# Patient Record
Sex: Female | Born: 1995 | Race: White | Hispanic: No | Marital: Married | Smoking: Former smoker
Health system: Southern US, Community
[De-identification: ages and names within clinical notes are randomized; demographics above are authoritative.]

## PROBLEM LIST (undated history)

## (undated) DIAGNOSIS — G43909 Migraine, unspecified, not intractable, without status migrainosus: Secondary | ICD-10-CM

## (undated) HISTORY — PX: WISDOM TOOTH EXTRACTION: SHX21

---

## 2018-08-20 ENCOUNTER — Encounter: Payer: Self-pay | Admitting: Obstetrics and Gynecology

## 2019-01-02 ENCOUNTER — Encounter: Payer: Self-pay | Admitting: Certified Nurse Midwife

## 2019-01-02 ENCOUNTER — Ambulatory Visit (INDEPENDENT_AMBULATORY_CARE_PROVIDER_SITE_OTHER): Payer: BLUE CROSS/BLUE SHIELD | Admitting: Certified Nurse Midwife

## 2019-01-02 ENCOUNTER — Other Ambulatory Visit (HOSPITAL_COMMUNITY)
Admission: RE | Admit: 2019-01-02 | Discharge: 2019-01-02 | Disposition: A | Discharge status: Home / Self Care | Payer: BLUE CROSS/BLUE SHIELD | Source: Ambulatory Visit | Attending: Certified Nurse Midwife | Admitting: Certified Nurse Midwife

## 2019-01-02 ENCOUNTER — Ambulatory Visit: Payer: Self-pay | Admitting: Adult Health

## 2019-01-02 VITALS — BP 112/81 | HR 85 | Ht 68.0 in | Wt 195.1 lb

## 2019-01-02 DIAGNOSIS — Z129 Encounter for screening for malignant neoplasm, site unspecified: Secondary | ICD-10-CM | POA: Insufficient documentation

## 2019-01-02 DIAGNOSIS — Z01419 Encounter for gynecological examination (general) (routine) without abnormal findings: Secondary | ICD-10-CM | POA: Diagnosis not present

## 2019-01-02 DIAGNOSIS — N946 Dysmenorrhea, unspecified: Secondary | ICD-10-CM | POA: Diagnosis not present

## 2019-01-02 DIAGNOSIS — N939 Abnormal uterine and vaginal bleeding, unspecified: Secondary | ICD-10-CM

## 2019-01-02 NOTE — Progress Notes (Signed)
GYNECOLOGY ANNUAL PREVENTATIVE CARE ENCOUNTER NOTE  Subjective:   Bonnie Ford is a 23 y.o. No obstetric history on file. female here for a routine annual gynecologic exam.  Current complaints: she complains of heavy painful periods.   Denies abnormal vaginal bleeding, discharge, pelvic pain, problems with intercourse or other gynecologic concerns. She is sexually active with female partner.    Gynecologic History Patient's last menstrual period was 12/12/2018 (exact date). Contraception: none Last Pap: n/a first one done today Last mammogram: n/a  Obstetric History OB History  Gravida Para Term Preterm AB Living  0 0 0 0 0 0  SAB TAB Ectopic Multiple Live Births  0 0 0 0 0    History reviewed. No pertinent past medical history.  History reviewed. No pertinent surgical history.  No current outpatient medications on file prior to visit.   No current facility-administered medications on file prior to visit.    Denies drugs, MJ, occasional alcohol ( 1 x month) exercises 3-4 time wk x 45 min.   Hx of migraines with aura, no surgical hx, no medical problems.   No Known Allergies  Social History:  reports that she has never smoked. She has never used smokeless tobacco. She reports previous alcohol use. She reports previous drug use.  Family History  Problem Relation Age of Onset  . Diabetes Mother   . Thyroid disease Mother   . Diabetes Sister     The following portions of the patient's history were reviewed and updated as appropriate: allergies, current medications, past family history, past medical history, past social history, past surgical history and problem list.  Review of Systems Pertinent items noted in HPI and remainder of comprehensive ROS otherwise negative.   Objective:  BP 112/81   Pulse 85   Ht 5\' 8"  (1.727 m)   Wt 195 lb 2 oz (88.5 kg)   LMP 12/12/2018 (Exact Date)   BMI 29.67 kg/m  CONSTITUTIONAL: Well-developed, well-nourished female in no acute  distress.  HENT:  Normocephalic, atraumatic, External right and left ear normal. Oropharynx is clear and moist EYES: Conjunctivae and EOM are normal. Pupils are equal, round, and reactive to light. No scleral icterus.  NECK: Normal range of motion, supple, no masses.  Normal thyroid.  SKIN: Skin is warm and dry. No rash noted. Not diaphoretic. No erythema. No pallor. MUSCULOSKELETAL: Normal range of motion. No tenderness.  No cyanosis, clubbing, or edema.  2+ distal pulses. NEUROLOGIC: Alert and oriented to person, place, and time. Normal reflexes, muscle tone coordination. No cranial nerve deficit noted. PSYCHIATRIC: Normal mood and affect. Normal behavior. Normal judgment and thought content. CARDIOVASCULAR: Normal heart rate noted, regular rhythm RESPIRATORY: Clear to auscultation bilaterally. Effort and breath sounds normal, no problems with respiration noted. BREASTS: Symmetric in size. No masses, skin changes, nipple drainage, or lymphadenopathy. ABDOMEN: Soft, normal bowel sounds, no distention noted.  No tenderness, rebound or guarding.  PELVIC: Normal appearing external genitalia; normal appearing vaginal mucosa and cervix.  No abnormal discharge noted.  Pap smear obtained.  Normal uterine size,right ovary feels enlarged, no uterine or adnexal tenderness.    Assessment and Plan:  Women's annual routine GYN exam Will follow up results of pap smear and manage accordingly. Mammogram Not indicated Labs. STD testing with pap smear. Declines blood work.  U/s ordered for AUB and enlarged ovary.  Pt encouraged to make another appointment to discuss birth control options for AUB.Discussed estrogen not option due to hx migraine with aura.   Routine preventative  health maintenance measures emphasized. Please refer to After Visit Summary for other counseling recommendations.   Doreene Burke, CNM

## 2019-01-02 NOTE — Patient Instructions (Signed)
Preventing Cervical Cancer  Cervical cancer is cancer that grows on the cervix. The cervix is at the bottom of the uterus. It connects the uterus to the vagina. The uterus is where a baby develops during pregnancy. Cancer occurs when cells become abnormal and start to grow out of control. Cervical cancer grows slowly and may not cause any symptoms at first. Over time, the cancer can grow deep into the cervix tissue and spread to other areas. If it is found early, cervical cancer can be treated effectively. You can also take steps to prevent this type of cancer. Most cases of cervical cancer are caused by an STI (sexually transmitted infection) called human papillomavirus (HPV). One way to reduce your risk of cervical cancer is to avoid infection with the HPV virus. You can do this by practicing safe sex and by getting the HPV vaccine. Getting regular Pap tests is also important because this can help identify changes in cells that could lead to cancer. Your chances of getting this disease can also be reduced by making certain lifestyle changes. How can I protect myself from cervical cancer? Preventing HPV infection  Ask your health care provider about getting the HPV vaccine. If you are 23 years old or younger, you may need to get this vaccine, which is given in three doses over 6 months. This vaccine protects against the types of HPV that could cause cancer.  Limit the number of people you have sex with. Also avoid having sex with people who have had many sex partners.  Use a latex condom during sex. Getting Pap tests  Get Pap tests regularly, starting at age 23. Talk with your health care provider about how often you need these tests. ? Most women who are 55?23 years of age should have a Pap test every 3 years. ? Most women who are 28?23 years of age should have a Pap test in combination with an HPV test every 5 years. ? Women with a higher risk of cervical cancer, such as those with a weakened  immune system or those who have been exposed to the drug diethylstilbestrol (DES), may need more frequent testing. Making other lifestyle changes  Do not use any products that contain nicotine or tobacco, such as cigarettes and e-cigarettes. If you need help quitting, ask your health care provider.  Eat at least 5 servings of fruits and vegetables every day.  Lose weight if you are overweight. Why are these changes important?  These changes and screening tests are designed to address the factors that are known to increase the risk of cervical cancer. Taking these steps is the best way to reduce your risk.  Having regular Pap tests will help identify changes in cells that could lead to cancer. Steps can then be taken to prevent cancer from developing.  These changes will also help find cervical cancer early. This type of cancer can be treated effectively if it is found early. It can be more dangerous and difficult to treat if cancer has grown deep into your cervix or has spread.  In addition to making you less likely to get cervical cancer, these changes will also provide other health benefits, such as the following: ? Practicing safe sex is important for preventing STIs and unplanned pregnancies. ? Avoiding tobacco can reduce your risk for other cancers and health issues. ? Eating a healthy diet and maintaining a healthy weight are good for your overall health. What can happen if changes are not made? In  the early stages, cervical cancer might not have any symptoms. It can take many years for the cancer to grow and get deep into the cervix tissue. This may be happening without you knowing about it. If you develop any symptoms, such as pelvic pain or unusual discharge or bleeding from your vagina, you should see your health care provider right away. If cervical cancer is not found early, you might need treatments such as radiation, chemotherapy, or surgery. In some cases, surgery may mean that  you will not be able to get pregnant or carry a pregnancy to term. Where to find support Talk with your health care provider, school nurse, or local health department for guidance about screening and vaccination. Some children and teens may be able to get the HPV vaccine free of charge through the U.S. government's Vaccines for Children [facility]) program. Other places that provide vaccinations include:  Public health clinics. Check with your local health department.  [place], where you would pay only what you can afford. To find one near you, check this website: http://lyons.com/  [place]. These are part of a program for Medicare and Medicaid patients who live in rural areas. The National Breast and Cervical Cancer Early Detection Program also provides breast and cervical cancer screenings and diagnostic services to low-income, uninsured, and underinsured women. Cervical cancer can be passed down through families. Talk with your health care provider or genetic counselor to learn more about genetic testing for cancer. Where to find more information Learn more about cervical cancer from:  [employer] of Gynecology: WirelessShades.ch  [place]: www.cancer.org/cancer/cervicalcancer/  U.S. Centers for Disease Control and Prevention: ParisianParasols.gl Summary  Talk with your health care provider about getting the HPV vaccine.  Be sure to get regular Pap tests as recommended by your health care provider.  See your health care provider right away if you have any pelvic pain or unusual discharge or bleeding from your vagina. This information is not intended to replace advice given to you by your health care provider. Make sure you discuss any questions you have with your health care provider. Document Released: 11/15/2015 Document Revised: 06/28/2016 Document Reviewed: 06/28/2016 Elsevier  Interactive Patient Education  2019 [place] 18-39 Years, Female Preventive care refers to lifestyle choices and visits with your health care provider that can promote health and wellness. What does preventive care include?   A yearly physical exam. This is also called an annual well check.  Dental exams once or twice a year.  Routine eye exams. Ask your health care provider how often you should have your eyes checked.  Personal lifestyle choices, including: ? Daily care of your teeth and gums. ? Regular physical activity. ? Eating a healthy diet. ? Avoiding tobacco and drug use. ? Limiting alcohol use. ? Practicing safe sex. ? Taking vitamin and mineral supplements as recommended by your health care provider. What happens during an annual well check? The services and screenings done by your health care provider during your annual well check will depend on your age, overall health, lifestyle risk factors, and family history of disease. Counseling Your health care provider may ask you questions about your:  Alcohol use.  Tobacco use.  Drug use.  Emotional well-being.  Home and relationship well-being.  Sexual activity.  Eating habits.  Work and work Statistician.  Method of birth control.  Menstrual cycle.  Pregnancy history. Screening You may have the following tests or measurements:  Height, weight,  and BMI.  Diabetes screening. This is done by checking your blood sugar (glucose) after you have not eaten for a while (fasting).  Blood pressure.  Lipid and cholesterol levels. These may be checked every 5 years starting at age 71.  Skin check.  Hepatitis C blood test.  Hepatitis B blood test.  Sexually transmitted disease (STD) testing.  BRCA-related cancer screening. This may be done if you have a family history of breast, ovarian, tubal, or peritoneal cancers.  Pelvic exam and Pap test. This may be done every 3 years starting at  age 66. Starting at age 67, this may be done every 5 years if you have a Pap test in combination with an HPV test. Discuss your test results, treatment options, and if necessary, the need for more tests with your health care provider. Vaccines Your health care provider may recommend certain vaccines, such as:  Influenza vaccine. This is recommended every year.  Tetanus, diphtheria, and acellular pertussis (Tdap, Td) vaccine. You may need a Td booster every 10 years.  Varicella vaccine. You may need this if you have not been vaccinated.  HPV vaccine. If you are 81 or younger, you may need three doses over 6 months.  Measles, mumps, and rubella (MMR) vaccine. You may need at least one dose of MMR. You may also need a second dose.  Pneumococcal 13-valent conjugate (PCV13) vaccine. You may need this if you have certain conditions and were not previously vaccinated.  Pneumococcal polysaccharide (PPSV23) vaccine. You may need one or two doses if you smoke cigarettes or if you have certain conditions.  Meningococcal vaccine. One dose is recommended if you are age 67-21 years and a first-year college student living in a residence hall, or if you have one of several medical conditions. You may also need additional booster doses.  Hepatitis A vaccine. You may need this if you have certain conditions or if you travel or work in places where you may be exposed to hepatitis A.  Hepatitis B vaccine. You may need this if you have certain conditions or if you travel or work in places where you may be exposed to hepatitis B.  Haemophilus influenzae type b (Hib) vaccine. You may need this if you have certain risk factors. Talk to your health care provider about which screenings and vaccines you need and how often you need them. This information is not intended to replace advice given to you by your health care provider. Make sure you discuss any questions you have with your health care provider. Document  Released: 12/27/2001 Document Revised: 06/13/2017 Document Reviewed: 09/01/2015 Elsevier Interactive Patient Education  2019 [employer].

## 2019-01-08 LAB — CYTOLOGY - PAP
Chlamydia: NEGATIVE
DIAGNOSIS: NEGATIVE
Neisseria Gonorrhea: NEGATIVE

## 2019-01-09 ENCOUNTER — Other Ambulatory Visit: Payer: BLUE CROSS/BLUE SHIELD

## 2019-01-29 ENCOUNTER — Other Ambulatory Visit: Payer: Self-pay

## 2019-01-29 ENCOUNTER — Ambulatory Visit
Admission: RE | Admit: 2019-01-29 | Discharge: 2019-01-29 | Disposition: A | Discharge status: Home / Self Care | Payer: BLUE CROSS/BLUE SHIELD | Source: Ambulatory Visit | Attending: Certified Nurse Midwife | Admitting: Certified Nurse Midwife

## 2019-01-29 ENCOUNTER — Other Ambulatory Visit: Payer: BLUE CROSS/BLUE SHIELD

## 2019-01-29 DIAGNOSIS — N939 Abnormal uterine and vaginal bleeding, unspecified: Secondary | ICD-10-CM | POA: Diagnosis not present

## 2019-09-29 ENCOUNTER — Emergency Department
Admission: EM | Admit: 2019-09-29 | Discharge: 2019-09-29 | Disposition: A | Discharge status: Home / Self Care | Payer: BLUE CROSS/BLUE SHIELD | Attending: Emergency Medicine | Admitting: Emergency Medicine

## 2019-09-29 ENCOUNTER — Other Ambulatory Visit: Payer: Self-pay

## 2019-09-29 DIAGNOSIS — F17210 Nicotine dependence, cigarettes, uncomplicated: Secondary | ICD-10-CM | POA: Insufficient documentation

## 2019-09-29 DIAGNOSIS — K0889 Other specified disorders of teeth and supporting structures: Secondary | ICD-10-CM | POA: Insufficient documentation

## 2019-09-29 MED ORDER — IBUPROFEN 800 MG PO TABS: 800.0000 mg | ORAL_TABLET | Freq: Three times a day (TID) | ORAL | 0 refills | Status: AC | PRN

## 2019-09-29 MED ORDER — AMOXICILLIN 875 MG PO TABS: 875.0000 mg | ORAL_TABLET | Freq: Two times a day (BID) | ORAL | 0 refills | Status: AC

## 2019-09-29 NOTE — Discharge Instructions (Signed)
OPTIONS FOR DENTAL FOLLOW UP CARE ° °[place] Department of Health and Human Services - Local Safety Net Dental Clinics °http://www.ncdhhs.gov/dph/oralhealth/services/safetynetclinics.htm °  °[facility] (336-562-3123) ° °[facility] [place] (919-933-9087) ° °[facility] (919-663-1744 ext [phone]) ° °[facility] ([phone]) ° °[facility] ([phone]) °This clinic caters to the indigent population and is on a lottery system. °Location: °[facility], [address], [place] °Clinic Hours: °Wednesdays from 6pm - 9pm, patients seen by a lottery system. °For dates, call or go to www.med.[facility].edu/[facility]/patients/Dental-[facility] °Services: °Cleanings, fillings and simple extractions. °Payment Options: °DENTAL WORK IS FREE OF CHARGE. Bring proof of income or support. °Best way to get seen: °Arrive at 5:15 pm - this is a lottery, NOT first come/first serve, so arriving earlier will not increase your chances of being seen. °  °  °[facility] °[phone] °Select option 1 for emergencies °  °Location: °[facility], [address], [place] °Clinic Hours: °No walk-ins accepted - call the day before to schedule an appointment. °Check in times are 9:30 am and 1:30 pm. °Services: °Simple extractions, temporary fillings, pulpectomy/pulp debridement, uncomplicated abscess drainage. °Payment Options: °PAYMENT IS DUE AT THE TIME OF SERVICE.  Fee is usually $100-200, additional surgical procedures (e.g. abscess drainage) may be extra. °Cash, checks, Visa/MasterCard accepted.  Can file Medicaid if patient is covered for dental - patient should call case worker to check. °No discount for [facility] patients. °Best way to get seen: °MUST call the day before and get onto the schedule. Can usually be seen the next 1-2 days. No walk-ins accepted. °  °  °[facility] °[phone] °   °Location: °[facility], [address], [place] °Clinic Hours: °M, W, Th, F 8am or 1:30pm, Tues 9a or 1:30 - first come/first served. °Services: °Simple extractions, temporary fillings, uncomplicated abscess drainage.  You do not need to be an [place] resident. °Payment Options: °PAYMENT IS DUE AT THE TIME OF SERVICE. °Dental insurance, otherwise sliding scale - bring proof of income or support. °Depending on income and treatment needed, cost is usually $50-200. °Best way to get seen: °Arrive early as it is first come/first served. °  °  °[facility] °[phone] °  °Location: °[address] °Clinic Hours: °Mon-Thu 8a-5p °Services: °Most basic dental services including extractions and fillings. °Payment Options: °PAYMENT IS DUE AT THE TIME OF SERVICE. °Sliding scale, up to 50% off - bring proof if income or support. °Medicaid with dental option accepted. °Best way to get seen: °Call to schedule an appointment, can usually be seen within 2 weeks OR they will try to see walk-ins - show up at 8a or 2p (you may have to wait). °  °  °[facility] °[phone] °[place] RESIDENTS ONLY °  °Location: °[facility], [address], [place], [place] [address] °Clinic Hours: By appointment only. °Monday - Thursday 8am-5pm, Friday 8am-12pm °Services: Cleanings, fillings, extractions. °Payment Options: °PAYMENT IS DUE AT THE TIME OF SERVICE. °Cash, Visa or MasterCard. Sliding scale - $30 minimum per service. °Best way to get seen: °Come in to office, complete packet and make an appointment - need proof of income °or support monies for each household member and proof of [place] residence. °Usually takes about a month to get in. °  °  °[facility] °[phone] °  °Location: °[address]   [place] °Clinic Hours: Walk-in Urgent Care Dental Services are offered Monday-Friday  mornings only. °The numbers of emergencies accepted daily is limited to the number of °providers available. °Maximum 15 - Mondays, Wednesdays & Thursdays °Maximum 10 - Tuesdays & Fridays °Services: °You do not need to be a [place] resident to be seen for a dental emergency. °Emergencies are defined as pain, swelling, abnormal bleeding, or dental trauma. Walkins will receive x-rays if needed. °NOTE: Dental cleaning is not an emergency. °Payment Options: °PAYMENT IS DUE AT THE TIME OF SERVICE. °Minimum co-pay is $40.00 for uninsured patients. °Minimum co-pay is $3.00 for Medicaid with dental coverage. °Dental Insurance is accepted and must be presented at time of visit. °Medicare does not cover dental. °Forms of payment: Cash, credit card, checks. °Best way to get seen: °If not previously registered with the clinic, walk-in dental registration begins at 7:15 am and is on a first come/first serve basis. °If previously registered with the clinic, call to make an appointment. °  °  °[facility] °[phone] °[place] RESIDENTS ONLY °  °Location: °[address], [place], [place] °Clinic Hours: °Mon-Thu 10a-2p °Services: Extractions only! °Payment Options: °FREE (donations accepted) - bring proof of income or support °Best way to get seen: °Call and schedule an appointment OR come at 8am on the 1st Monday of every month (except for holidays) when it is first come/first served. °  °  °[facility] °[phone] °  °Location: °[address], [place] °Clinic Hours: °Friday mornings °Services, Payment Options, Best way to get seen: °Call for info °

## 2019-09-29 NOTE — ED Triage Notes (Signed)
Pt arrives to ED c/o R lower wisdom tooth pain. Thinks poss abscess. States has a plan to get wisdom teeth removed. C/o R jaw pain. A&O, ambulatory. Speaking in complete sentences.

## 2019-09-29 NOTE — ED Provider Notes (Signed)
[facility] Emergency Department Provider Note  ____________________________________________  Time seen: Approximately 4:14 PM  I have reviewed the triage vital signs and the nursing notes.   HISTORY  Chief Complaint Dental Pain    HPI Bonnie Ford is a 23 y.o. female presents to the emergency department with right lower jaw pain and swelling.  Patient states that she feels like her symptoms are coming from her wisdom teeth.  She denies fever or chills at home.  She states that intensity of pain made her leave work today and now she needs a work note.  She has not attempted any alleviating measures at home and does not have an appointment with a local dentist.        History reviewed. No pertinent past medical history.  Patient Active Problem List   Diagnosis Date Noted  . Painful menstrual periods 01/02/2019    History reviewed. No pertinent surgical history.  Prior to Admission medications   Medication Sig Start Date End Date Taking? Authorizing Provider  amoxicillin (AMOXIL) 875 MG tablet Take 1 tablet (875 mg total) by mouth 2 (two) times daily for 10 days. 09/29/19 10/09/19  Lannie Fields, PA-C  ibuprofen (ADVIL) 800 MG tablet Take 1 tablet (800 mg total) by mouth every 8 (eight) hours as needed for up to 5 days. 09/29/19 10/04/19  Lannie Fields, PA-C    Allergies Patient has no known allergies.  Family History  Problem Relation Age of Onset  . Diabetes Mother   . Thyroid disease Mother   . Diabetes Sister     Social History Social History   Tobacco Use  . Smoking status: Current Every Day Smoker  . Smokeless tobacco: Never Used  Substance Use Topics  . Alcohol use: Not Currently  . Drug use: Not Currently     Review of Systems  Constitutional: No fever/chills Eyes: No visual changes. No discharge ENT: Patient has dental pain.  Cardiovascular: no chest pain. Respiratory: no cough. No SOB. Gastrointestinal: No abdominal  pain.  No nausea, no vomiting.  No diarrhea.  No constipation. Genitourinary: Negative for dysuria. No hematuria Musculoskeletal: Negative for musculoskeletal pain. Skin: Negative for rash, abrasions, lacerations, ecchymosis. Neurological: Negative for headaches, focal weakness or numbness.   ____________________________________________   PHYSICAL EXAM:  VITAL SIGNS: ED Triage Vitals  Enc Vitals Group     BP 09/29/19 1348 (!) 140/96     Pulse Rate 09/29/19 1348 72     Resp 09/29/19 1348 16     Temp 09/29/19 1348 98.6 F (37 C)     Temp Source 09/29/19 1348 Oral     SpO2 09/29/19 1348 98 %     Weight 09/29/19 1349 190 lb (86.2 kg)     Height 09/29/19 1349 5\' 8"  (1.727 m)     Head Circumference --      Peak Flow --      Pain Score 09/29/19 1349 8     Pain Loc --      Pain Edu? --      Excl. in [place]? --      Constitutional: Alert and oriented. Well appearing and in no acute distress. Eyes: Conjunctivae are normal. PERRL. EOMI. Head: Atraumatic. ENT:      Nose: No congestion/rhinnorhea.      Mouth/Throat: Mucous membranes are moist.  Patient has mild right lower jaw swelling.  No tenderness to palpation underneath the tongue.  Patient has exceptionally healthy dentition overall with no dental caries visualized. Neck:  No stridor.  No cervical spine tenderness to palpation. Cardiovascular: Normal rate, regular rhythm. Normal S1 and S2.  Good peripheral circulation. Respiratory: Normal respiratory effort without tachypnea or retractions. Lungs CTAB. Good air entry to the bases with no decreased or absent breath sounds. Skin:  Skin is warm, dry and intact. No rash noted. Psychiatric: Mood and affect are normal. Speech and behavior are normal. Patient exhibits appropriate insight and judgement.   ____________________________________________   LABS (all labs ordered are listed, but only abnormal results are displayed)  Labs Reviewed - No data to  display ____________________________________________  EKG   ____________________________________________  RADIOLOGY   No results found.  ____________________________________________    PROCEDURES  Procedure(s) performed:    Procedures    Medications - No data to display   ____________________________________________   INITIAL IMPRESSION / ASSESSMENT AND PLAN / ED COURSE  Pertinent labs & imaging results that were available during my care of the patient were reviewed by me and considered in my medical decision making (see chart for details).  Review of the [place] CSRS was performed in accordance of the [facility] prior to dispensing any controlled drugs.         Assessment and plan Dental pain 23 year old female presents to the emergency department with mild right lower jaw swelling and concern for wisdom tooth type pain.  Patient was discharged with amoxicillin and ibuprofen 800s.  She was advised to make an appointment with a local dentist as soon as possible.  Dental resources were provided in patient's discharge paperwork.  All patient questions were answered.     ____________________________________________  FINAL CLINICAL IMPRESSION(S) / ED DIAGNOSES  Final diagnoses:  Pain, dental      NEW MEDICATIONS STARTED DURING THIS VISIT:  ED Discharge Orders         Ordered    amoxicillin (AMOXIL) 875 MG tablet  2 times daily     09/29/19 1556    ibuprofen (ADVIL) 800 MG tablet  Every 8 hours PRN     09/29/19 1556              This chart was dictated using voice recognition software/Dragon. Despite best efforts to proofread, errors can occur which can change the meaning. Any change was purely unintentional.    Orvil Feil, PA-C 09/29/19 1617    Dionne Bucy, MD 09/29/19 [phone]

## 2019-11-19 ENCOUNTER — Ambulatory Visit: Payer: 59 | Attending: Internal Medicine

## 2019-11-19 DIAGNOSIS — Z20822 Contact with and (suspected) exposure to covid-19: Secondary | ICD-10-CM

## 2019-11-21 LAB — NOVEL CORONAVIRUS, NAA: SARS-CoV-2, NAA: DETECTED — AB

## 2019-11-22 ENCOUNTER — Ambulatory Visit: Payer: 59 | Attending: Internal Medicine

## 2019-11-22 DIAGNOSIS — Z20822 Contact with and (suspected) exposure to covid-19: Secondary | ICD-10-CM

## 2019-11-24 LAB — NOVEL CORONAVIRUS, NAA: SARS-CoV-2, NAA: NOT DETECTED

## 2020-12-16 DIAGNOSIS — Z20822 Contact with and (suspected) exposure to covid-19: Secondary | ICD-10-CM | POA: Diagnosis not present

## 2020-12-16 DIAGNOSIS — Z03818 Encounter for observation for suspected exposure to other biological agents ruled out: Secondary | ICD-10-CM | POA: Diagnosis not present

## 2021-02-02 ENCOUNTER — Emergency Department: Payer: Self-pay

## 2021-02-02 ENCOUNTER — Other Ambulatory Visit: Payer: Self-pay

## 2021-02-02 ENCOUNTER — Emergency Department
Admission: EM | Admit: 2021-02-02 | Discharge: 2021-02-02 | Disposition: A | Discharge status: Home / Self Care | Payer: Self-pay | Attending: Emergency Medicine | Admitting: Emergency Medicine

## 2021-02-02 DIAGNOSIS — X501XXA Overexertion from prolonged static or awkward postures, initial encounter: Secondary | ICD-10-CM | POA: Insufficient documentation

## 2021-02-02 DIAGNOSIS — Y9301 Activity, walking, marching and hiking: Secondary | ICD-10-CM | POA: Insufficient documentation

## 2021-02-02 DIAGNOSIS — S93402A Sprain of unspecified ligament of left ankle, initial encounter: Secondary | ICD-10-CM | POA: Insufficient documentation

## 2021-02-02 MED ORDER — ACETAMINOPHEN 325 MG PO TABS
650.0000 mg | ORAL_TABLET | Freq: Once | ORAL | Status: AC
  Administered 2021-02-02: 650 mg via ORAL
  Filled 2021-02-02: qty 2

## 2021-02-02 MED ORDER — MELOXICAM 15 MG PO TABS: 15.0000 mg | ORAL_TABLET | Freq: Every day | ORAL | 0 refills | Status: AC

## 2021-02-02 MED ORDER — MELOXICAM 7.5 MG PO TABS
15.0000 mg | ORAL_TABLET | Freq: Once | ORAL | Status: AC
  Administered 2021-02-02: 15 mg via ORAL
  Filled 2021-02-02: qty 2

## 2021-02-02 NOTE — ED Triage Notes (Signed)
Pt in with co left ankle pain, was walking and twisted it. STates heard a pop, no obvious deformity at this time.

## 2021-02-02 NOTE — Discharge Instructions (Addendum)
Use antiinflammatory as prescribed, once daily.  I may also use Tylenol, up to 1000 mg 4 times daily.  Please follow-up with orthopedics if symptoms do not improve in 1 to 2 weeks.

## 2021-02-03 NOTE — ED Provider Notes (Signed)
[facility] Emergency Department Provider Note  ____________________________________________   Event Date/Time   First MD Initiated Contact with Patient 02/02/21 2124     (approximate)  I have reviewed the triage vital signs and the nursing notes.   HISTORY  Chief Complaint Ankle Injury  HPI Bonnie Ford is a 25 y.o. female who reports to the emergency department for evaluation of left ankle pain.  Patient states that she was stepping down a step, and her foot inverted.  She states that she heard a pop and had immediate pain to the lateral aspect of her ankle.  She has been able to weight-bear on it since that time, but with increased pain.  She denies any prior injuries.  No alleviating measures have been attempted prior to arrival.         No past medical history on file.  Patient Active Problem List   Diagnosis Date Noted  . Painful menstrual periods 01/02/2019    No past surgical history on file.  Prior to Admission medications   Medication Sig Start Date End Date Taking? Authorizing Provider  meloxicam (MOBIC) 15 MG tablet Take 1 tablet (15 mg total) by mouth daily for 15 days. 02/02/21 02/17/21 Yes Lucy Chris, PA    Allergies Patient has no known allergies.  Family History  Problem Relation Age of Onset  . Diabetes Mother   . Thyroid disease Mother   . Diabetes Sister     Social History Social History   Tobacco Use  . Smoking status: Current Every Day Smoker  . Smokeless tobacco: Never Used  Substance Use Topics  . Alcohol use: Not Currently  . Drug use: Not Currently    Review of Systems Constitutional: No fever/chills Eyes: No visual changes. ENT: No sore throat. Cardiovascular: Denies chest pain. Respiratory: Denies shortness of breath. Gastrointestinal: No abdominal pain.  No nausea, no vomiting.  No diarrhea.  No constipation. Genitourinary: Negative for dysuria. Musculoskeletal: + Left ankle pain, negative  for back pain. Skin: Negative for rash. Neurological: Negative for headaches, focal weakness or numbness.   ____________________________________________   PHYSICAL EXAM:  VITAL SIGNS: ED Triage Vitals  Enc Vitals Group     BP 02/02/21 2135 118/72     Pulse Rate 02/02/21 2135 89     Resp 02/02/21 2135 17     Temp 02/02/21 2135 99.2 F (37.3 C)     Temp Source 02/02/21 2135 Oral     SpO2 02/02/21 2135 98 %     Weight 02/02/21 2126 215 lb (97.5 kg)     Height 02/02/21 2126 5\' 8"  (1.727 m)     Head Circumference --      Peak Flow --      Pain Score 02/02/21 2126 6     Pain Loc --      Pain Edu? --      Excl. in [place]? --    Constitutional: Alert and oriented. Well appearing and in no acute distress. Eyes: Conjunctivae are normal. PERRL. EOMI. Head: Atraumatic. Neck: No stridor.   Cardiovascular: Normal rate, regular rhythm. Grossly normal heart sounds.  Good peripheral circulation. Respiratory: Normal respiratory effort.  No retractions. Lungs CTAB. Musculoskeletal: There is tenderness to palpation to the lateral aspect of the ankle, most prominent just anterior to the lateral malleolus.  There is no ecchymosis, but there is a mild amount of soft tissue swelling.  Patient is able to actively dorsiflex and plantarflex ankle without difficulty.  Dorsal pedal  pulse 2+. Neurologic:  Normal speech and language. No gross focal neurologic deficits are appreciated.  Gait not assessed secondary to left ankle pain Skin:  Skin is warm, dry and intact. No rash noted. Psychiatric: Mood and affect are normal. Speech and behavior are normal.   ____________________________________________  RADIOLOGY I, Lucy Chris, personally viewed and evaluated these images (plain radiographs) as part of my medical decision making, as well as reviewing the written report by the radiologist.  ED provider interpretation: X-rays of the left ankle do not reveal any acute fracture  Official radiology  report(s): DG Ankle Complete Left  Result Date: 02/02/2021 CLINICAL DATA:  Left ankle pain, twisting injury EXAM: LEFT ANKLE COMPLETE - 3+ VIEW COMPARISON:  None. FINDINGS: Lateral soft tissue swelling. No fracture, subluxation or dislocation. Joint spaces maintained. 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 IMPRESSION: No fracture. Electronically Signed   By: Charlett Nose M.D.   On: 02/02/2021 21:50    ____________________________________________   INITIAL IMPRESSION / ASSESSMENT AND PLAN / ED COURSE  As part of my medical decision making, I reviewed the following data within the electronic MEDICAL RECORD NUMBER Nursing notes reviewed and incorporated, Radiograph reviewed and Notes from prior ED visits        Patient is a 25 year old female who reports to the emergency department for evaluation of left ankle pain.  See HPI for further details.  In triage, patient normal vital signs.  On physical exam there is soft tissue swelling and tenderness noted to the lateral aspect of the left ankle.  She has the ability to actively dorsiflex and plantarflex the ankle with minimal difficulty.  She is neurovascularly intact.  X-rays were obtained and are negative for acute fracture.  Will place the patient in a lace up ASO ankle brace and treat the patient with anti-inflammatories and recommend Tylenol as well.  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  Patient was instructed to follow-up with orthopedics if not improved in 1 to 2 weeks.  Patient is amenable this plan and questions were answered.  She is stable this time for outpatient follow-up.      ____________________________________________   FINAL CLINICAL IMPRESSION(S) / ED DIAGNOSES  Final diagnoses:  Sprain of left ankle, unspecified ligament, initial encounter     ED Discharge Orders         Ordered    meloxicam  (MOBIC) 15 MG tablet  Daily        02/02/21 2203          *Please note:  Bonnie Ford was evaluated in Emergency Department on 02/03/2021 for the symptoms described in the history of present illness. She was evaluated in the context of the global COVID-19 pandemic, which necessitated consideration that the patient might be at risk for infection with the SARS-CoV-2 virus that causes COVID-19. Institutional protocols and algorithms that pertain to the evaluation of patients at risk for COVID-19 are in a state of rapid change based on information released by regulatory bodies including the CDC and federal and state organizations. These policies and algorithms were followed during the patient's care in the ED.  Some ED evaluations and interventions may be delayed as a result of limited staffing during and the pandemic.*   Note:  This document was prepared using Dragon voice recognition software and may include unintentional dictation errors.   Lucy Chris,  PA 02/03/21 2114    Phineas Semen, MD 02/03/21 2153

## 2021-02-04 IMAGING — US US PELVIS COMPLETE WITH TRANSVAGINAL
1 series · 13 of 25 positions shown · non-contrast
Comparison: None

CLINICAL DATA: Painful heavy menses for years

EXAM:
TRANSABDOMINAL AND TRANSVAGINAL ULTRASOUND OF PELVIS
TECHNIQUE: Both transabdominal and transvaginal ultrasound examinations of the
pelvis were performed. Transabdominal technique was performed for
global imaging of the pelvis including uterus, ovaries, adnexal
regions, and pelvic cul-de-sac. It was necessary to proceed with
endovaginal exam following the transabdominal exam to visualize the
endometrium and ovaries.

[Series 1: us pelvis complete with transvaginal · 0.20mm/px · 13 of 131 slices shown]
[im 1/131]
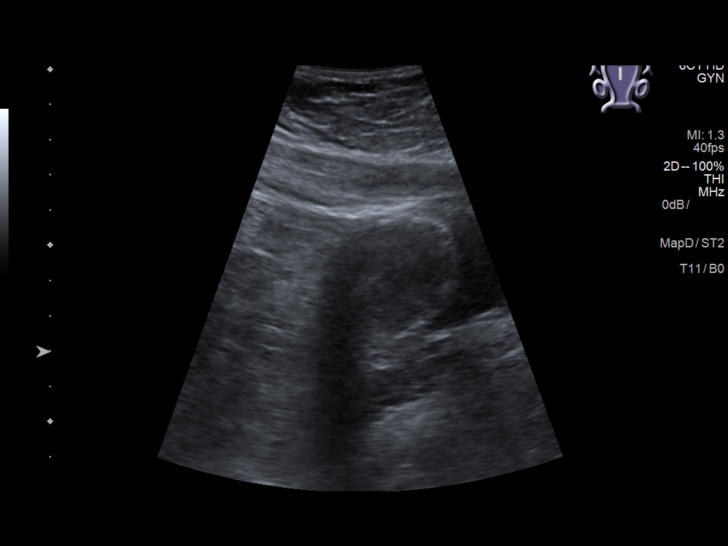
[im 11/131]
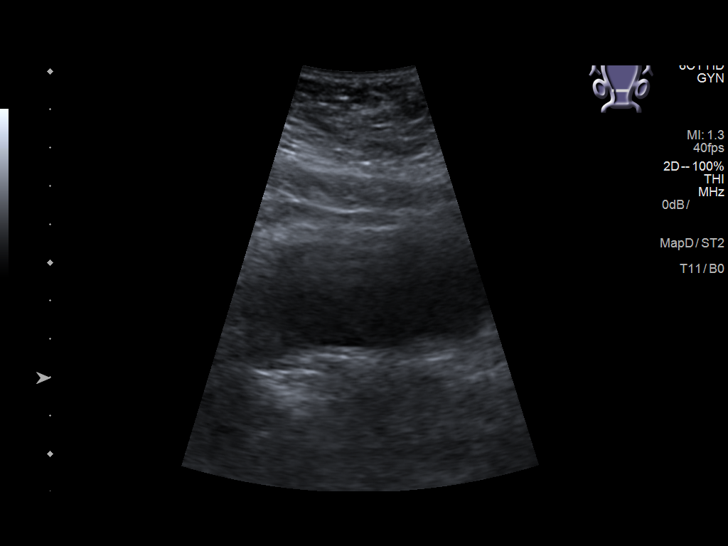
[im 22/131]
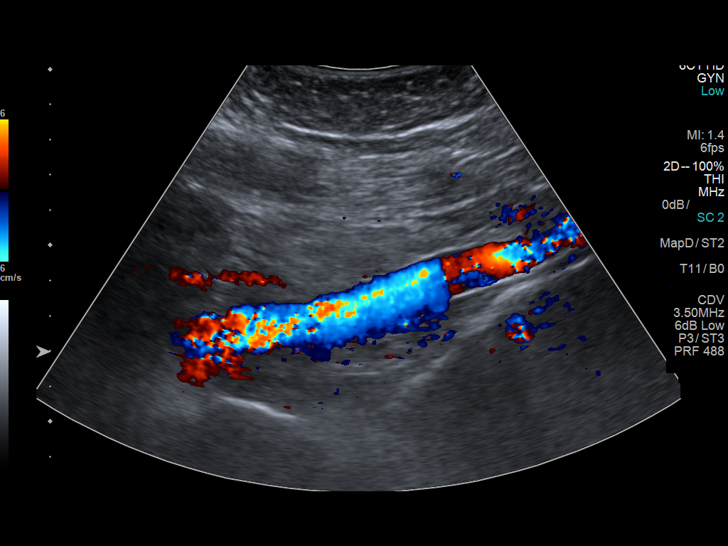
[im 33/131]
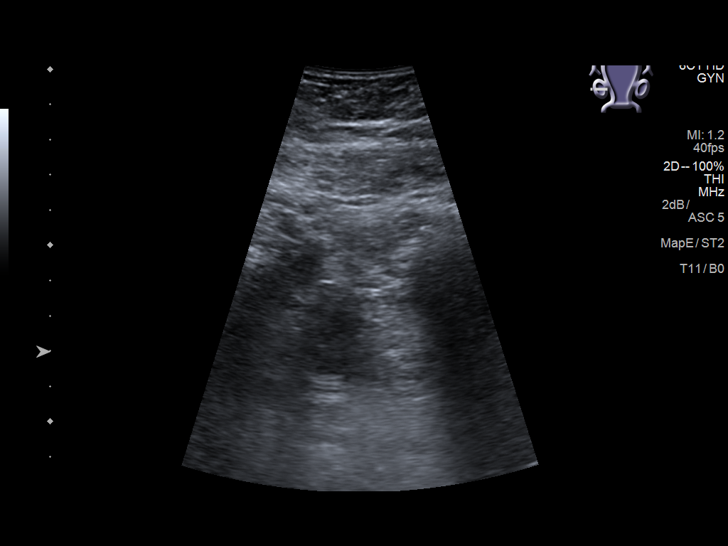
[im 44/131]
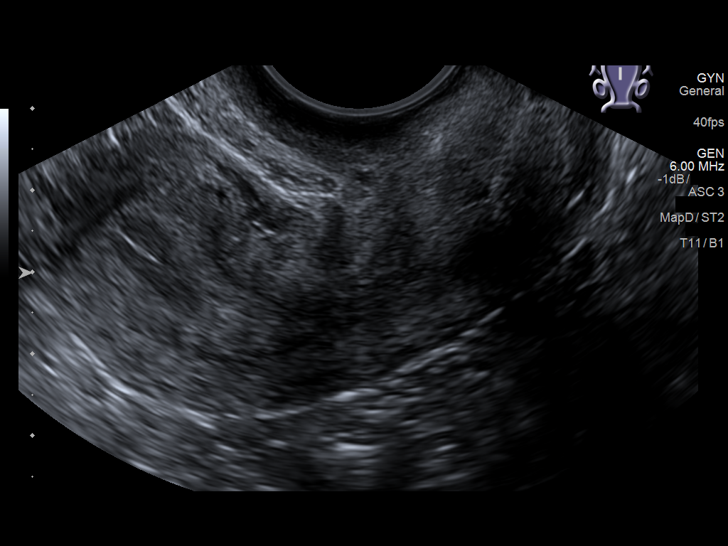
[im 55/131]
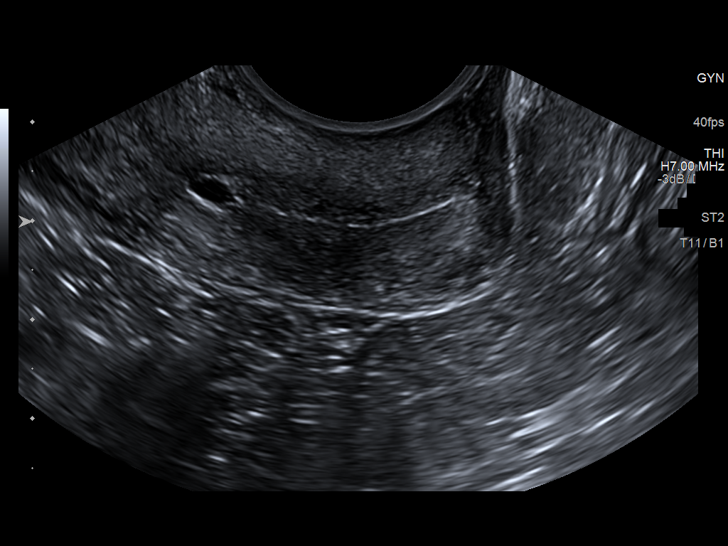
[im 66/131]
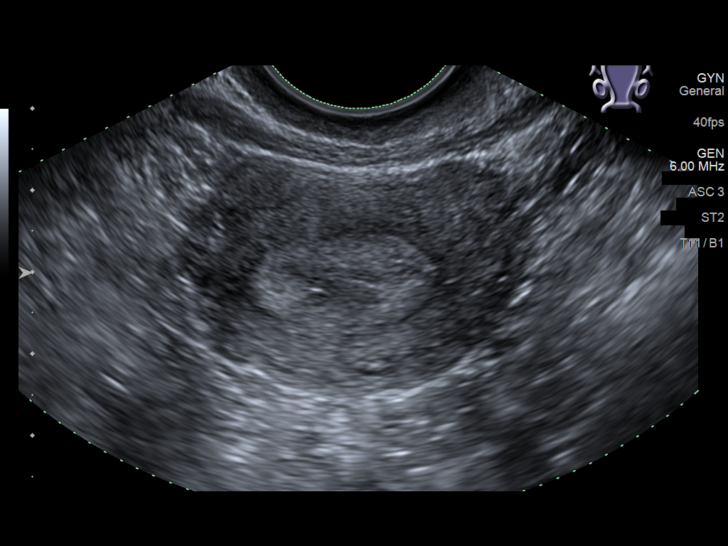
[im 76/131]
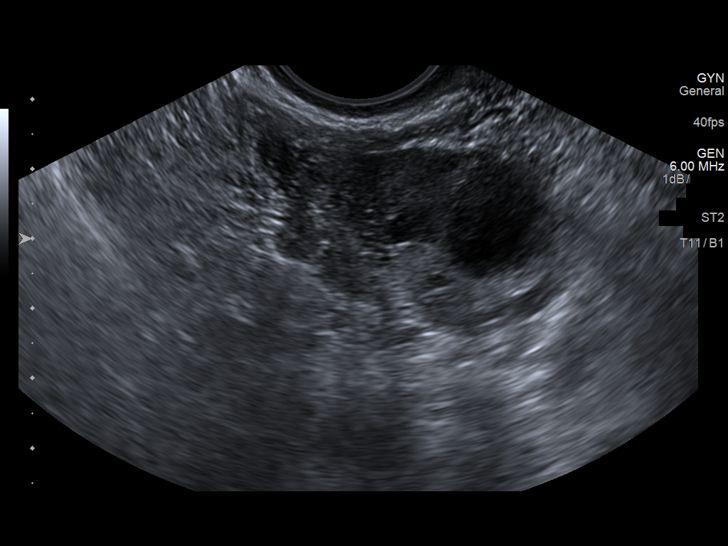
[im 87/131]
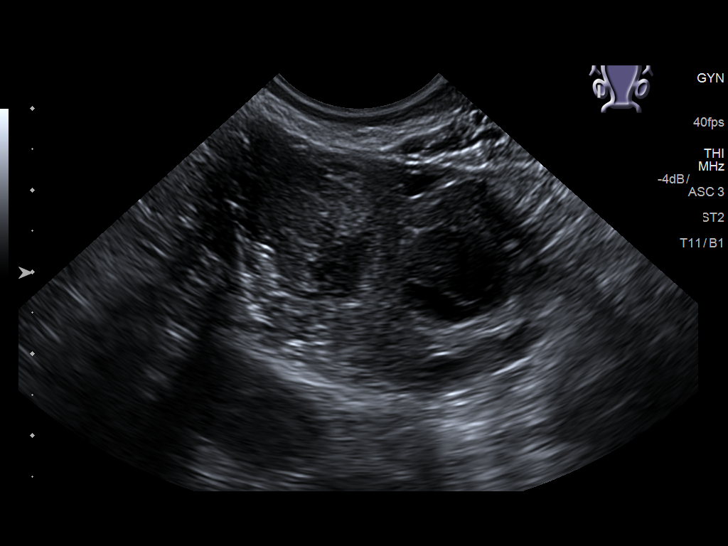
[im 98/131]
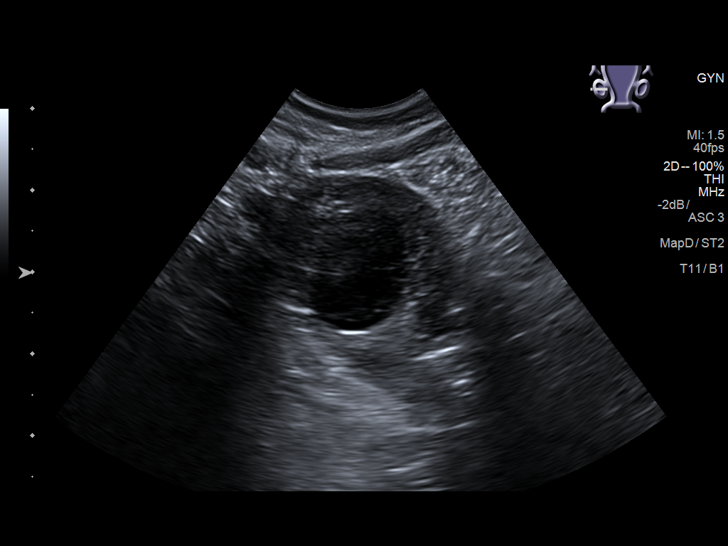
[im 109/131]
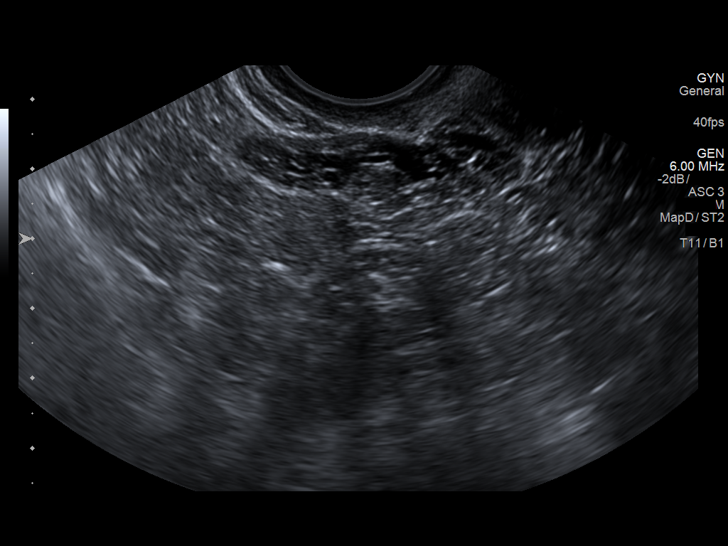
[im 120/131]
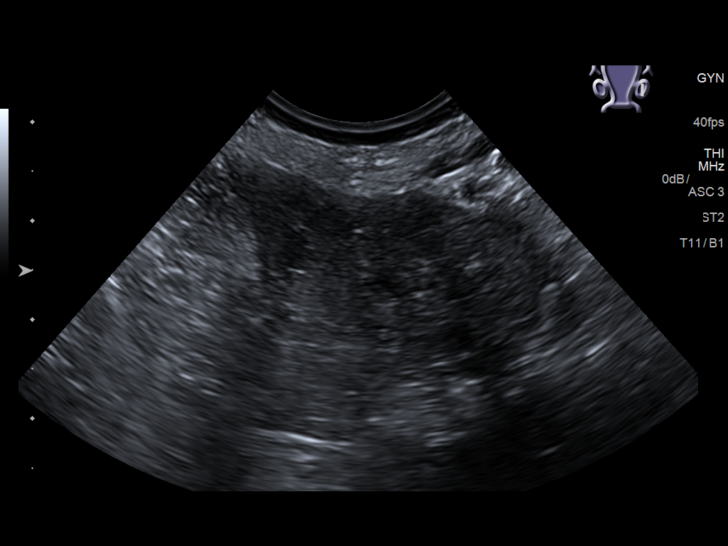
[im 131/131]
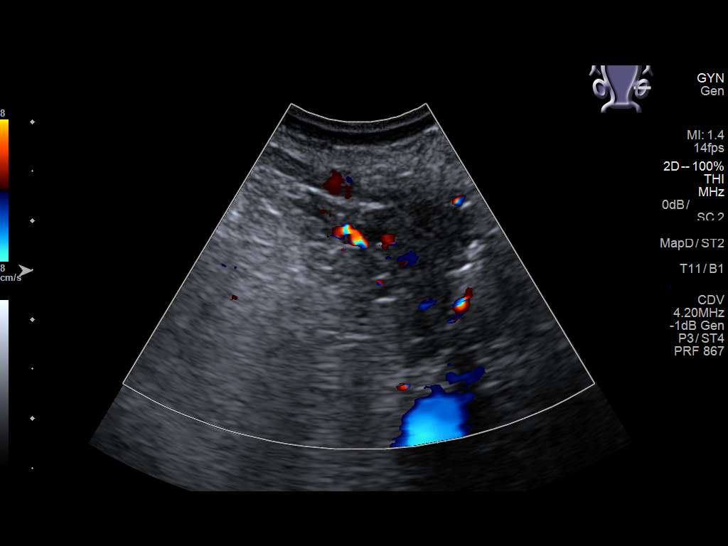

[13 of 25 positions shown; findings below may reference images not displayed]

FINDINGS: Uterus

Measurements: 6.7 x 3.0 x 4.1 cm = volume: 43 mL. Anteverted. Normal
morphology without mass. Probable nabothian cyst at cervix.

Endometrium

Thickness: 10 mm, normal.  No endometrial fluid or focal abnormality

Right ovary

Measurements: 3.8 x 2.7 x 2.3 cm = volume: 12.0 mL. Two small
complex cystic foci question hemorrhagic cysts or ruptured follicles
measuring 17 x 17 x 18 mm and 20 x 19 x 19 mm.

Left ovary

Measurements: 3.4 x 1.4 x 1.4 cm = volume: 3.5 mL. Normal morphology
without mass

Other findings

No free pelvic fluid.  No additional adnexal masses.
IMPRESSION: Unremarkable uterus and LEFT ovary.

Tiny hemorrhagic cysts or ruptured follicles within the RIGHT ovary.

No other pelvic sonographic abnormalities identified.

## 2021-04-04 ENCOUNTER — Encounter: Payer: Self-pay | Admitting: Emergency Medicine

## 2021-04-04 ENCOUNTER — Emergency Department
Admission: EM | Admit: 2021-04-04 | Discharge: 2021-04-05 | Disposition: A | Discharge status: Home / Self Care | Payer: BC Managed Care – PPO | Attending: Emergency Medicine | Admitting: Emergency Medicine

## 2021-04-04 ENCOUNTER — Other Ambulatory Visit: Payer: Self-pay

## 2021-04-04 DIAGNOSIS — R197 Diarrhea, unspecified: Secondary | ICD-10-CM | POA: Diagnosis not present

## 2021-04-04 DIAGNOSIS — R112 Nausea with vomiting, unspecified: Secondary | ICD-10-CM | POA: Insufficient documentation

## 2021-04-04 DIAGNOSIS — F17200 Nicotine dependence, unspecified, uncomplicated: Secondary | ICD-10-CM | POA: Insufficient documentation

## 2021-04-04 DIAGNOSIS — K219 Gastro-esophageal reflux disease without esophagitis: Secondary | ICD-10-CM | POA: Diagnosis not present

## 2021-04-04 LAB — COMPREHENSIVE METABOLIC PANEL
ALT: 17 U/L (ref 0–44)
AST: 17 U/L (ref 15–41)
Albumin: 4.6 g/dL (ref 3.5–5.0)
Alkaline Phosphatase: 91 U/L (ref 38–126)
Anion gap: 8 (ref 5–15)
BUN: 16 mg/dL (ref 6–20)
CO2: 26 mmol/L (ref 22–32)
Calcium: 9.3 mg/dL (ref 8.9–10.3)
Chloride: 108 mmol/L (ref 98–111)
Creatinine, Ser: 0.7 mg/dL (ref 0.44–1.00)
GFR, Estimated: >60 mL/min (ref >60)
Glucose, Bld: 116 mg/dL — ABNORMAL HIGH (ref 70–99)
Potassium: 3.5 mmol/L (ref 3.5–5.1)
Sodium: 142 mmol/L (ref 135–145)
Total Bilirubin: 0.6 mg/dL (ref 0.3–1.2)
Total Protein: 7.5 g/dL (ref 6.5–8.1)

## 2021-04-04 LAB — CBC
HCT: 41.2 % (ref 36.0–46.0)
Hemoglobin: 14.9 g/dL (ref 12.0–15.0)
MCH: 31.3 pg (ref 26.0–34.0)
MCHC: 36.2 g/dL — ABNORMAL HIGH (ref 30.0–36.0)
MCV: 86.6 fL (ref 80.0–100.0)
Platelets: 314 10*3/uL (ref 150–400)
RBC: 4.76 MIL/uL (ref 3.87–5.11)
RDW: 11.9 % (ref 11.5–15.5)
WBC: 10.9 10*3/uL — ABNORMAL HIGH (ref 4.0–10.5)
nRBC: 0 % (ref 0.0–0.2)

## 2021-04-04 LAB — LIPASE, BLOOD: Lipase: 29 U/L (ref 11–51)

## 2021-04-04 MED ORDER — PANTOPRAZOLE SODIUM 40 MG PO TBEC
40.0000 mg | DELAYED_RELEASE_TABLET | Freq: Once | ORAL | Status: AC
  Administered 2021-04-04: 40 mg via ORAL
  Filled 2021-04-04: qty 1

## 2021-04-04 MED ORDER — ONDANSETRON 4 MG PO TBDP
4.0000 mg | ORAL_TABLET | Freq: Once | ORAL | Status: AC
  Administered 2021-04-04: 4 mg via ORAL
  Filled 2021-04-04: qty 1

## 2021-04-04 NOTE — ED Triage Notes (Signed)
Pt reports that she has felt tired with N/V with diarrhea for the last several days. She took a COVID at home test and it was negative. She is suppose to go back to work tomorrow but they told her she can not come back to work until cleared.

## 2021-04-04 NOTE — ED Provider Notes (Signed)
[facility] Emergency Department Provider Note  ____________________________________________   Event Date/Time   First MD Initiated Contact with Patient 04/04/21 2304     (approximate)  I have reviewed the triage vital signs and the nursing notes.   HISTORY  Chief Complaint Diarrhea and Emesis    HPI Bonnie Ford is a 25 y.o. female history of GERD who presents to the emergency department with complaints of nausea, vomiting and diarrhea for the past week and a half.  States she has not had any vomiting and 3 days, no diarrhea in 2 days but continues to have nausea.  Low-grade temperatures of 99.2.  No dysuria, hematuria, vaginal bleeding or discharge.  No abdominal pain.  No history of abdominal surgeries.  Reports that she has symptoms of reflux daily.  She is not on medications for the same.        History reviewed. No pertinent past medical history.  Patient Active Problem List   Diagnosis Date Noted  . Painful menstrual periods 01/02/2019    History reviewed. No pertinent surgical history.  Prior to Admission medications   Medication Sig Start Date End Date Taking? Authorizing Provider  ondansetron (ZOFRAN ODT) 4 MG disintegrating tablet Take 1 tablet (4 mg total) by mouth every 6 (six) hours as needed for nausea or vomiting. 04/05/21  Yes Torre Schaumburg, Baxter Hire N, DO  pantoprazole (PROTONIX) 40 MG tablet Take 1 tablet (40 mg total) by mouth daily. 04/05/21 04/05/22 Yes Jaxx Huish, Layla Maw, DO    Allergies Patient has no known allergies.  Family History  Problem Relation Age of Onset  . Diabetes Mother   . Thyroid disease Mother   . Diabetes Sister     Social History Social History   Tobacco Use  . Smoking status: Current Every Day Smoker  . Smokeless tobacco: Never Used  Substance Use Topics  . Alcohol use: Not Currently  . Drug use: Not Currently    Review of Systems Constitutional: No fever. Eyes: No visual changes. ENT: No sore  throat. Cardiovascular: Denies chest pain. Respiratory: Denies shortness of breath. Gastrointestinal: + nausea, vomiting, diarrhea. Genitourinary: Negative for dysuria. Musculoskeletal: Negative for back pain. Skin: Negative for rash. Neurological: Negative for focal weakness or numbness.  ____________________________________________   PHYSICAL EXAM:  VITAL SIGNS: ED Triage Vitals  Enc Vitals Group     BP 04/04/21 2039 (!) 143/98     Pulse Rate 04/04/21 2039 85     Resp 04/04/21 2039 20     Temp 04/04/21 2039 99 F (37.2 C)     Temp Source 04/04/21 2039 Oral     SpO2 04/04/21 2039 98 %     Weight 04/04/21 2041 215 lb (97.5 kg)     Height 04/04/21 2041 5\' 7"  (1.702 m)     Head Circumference --      Peak Flow --      Pain Score 04/04/21 2041 0     Pain Loc --      Pain Edu? --      Excl. in [place]? --    CONSTITUTIONAL: Alert and oriented and responds appropriately to questions. Well-appearing; well-nourished HEAD: Normocephalic EYES: Conjunctivae clear, pupils appear equal, EOM appear intact ENT: normal nose; moist mucous membranes NECK: Supple, normal ROM CARD: RRR; S1 and S2 appreciated; no murmurs, no clicks, no rubs, no gallops RESP: Normal chest excursion without splinting or tachypnea; breath sounds clear and equal bilaterally; no wheezes, no rhonchi, no rales, no hypoxia or respiratory distress,  speaking full sentences ABD/GI: Normal bowel sounds; non-distended; soft, non-tender, no rebound, no guarding, no peritoneal signs, no hepatosplenomegaly BACK: The back appears normal EXT: Normal ROM in all joints; no deformity noted, no edema; no cyanosis SKIN: Normal color for age and race; warm; no rash on exposed skin NEURO: Moves all extremities equally PSYCH: The patient's mood and manner are appropriate.  ____________________________________________   LABS (all labs ordered are listed, but only abnormal results are displayed)  Labs Reviewed  COMPREHENSIVE  METABOLIC PANEL - Abnormal; Notable for the following components:      Result Value   Glucose, Bld 116 (*)    All other components within normal limits  CBC - Abnormal; Notable for the following components:   WBC 10.9 (*)    MCHC 36.2 (*)    All other components within normal limits  URINALYSIS, COMPLETE (UACMP) WITH MICROSCOPIC - Abnormal; Notable for the following components:   Color, Urine YELLOW (*)    APPearance HAZY (*)    Hgb urine dipstick LARGE (*)    All other components within normal limits  LIPASE, BLOOD  PREGNANCY, URINE   ____________________________________________  EKG   ____________________________________________  RADIOLOGY I, Loran Fleet, personally viewed and evaluated these images (plain radiographs) as part of my medical decision making, as well as reviewing the written report by the radiologist.  ED MD interpretation:    Official radiology report(s): No results found.  ____________________________________________   PROCEDURES  Procedure(s) performed (including Critical Care):  Procedures   ____________________________________________   INITIAL IMPRESSION / ASSESSMENT AND PLAN / ED COURSE  As part of my medical decision making, I reviewed the following data within the electronic MEDICAL RECORD NUMBER Nursing notes reviewed and incorporated, Labs reviewed , Old chart reviewed and Notes from prior ED visits         Patient here with likely viral gastroenteritis.  Abdominal exam is benign.  Doubt appendicitis, colitis, pancreatitis, pyelonephritis, kidney stone, colitis, diverticulitis, perforation, bowel obstruction.  Labs here are reassuring.  Urine pending.  Will give Zofran, Protonix, p.o. challenge.  ED PROGRESS  Patient's urine shows some red blood cells and white blood cells also many squamous cells and no bacteria.  I suspect this is a dirty catch and she is not having urinary symptoms.  She has no ketones in her urine suggesting that she  is well-hydrated.  Pregnancy test is negative.  Feeling better after Protonix, Zofran and tolerating p.o.  Will discharge home.  At this time, I do not feel there is any life-threatening condition present. I have reviewed, interpreted and discussed all results (EKG, imaging, lab, urine as appropriate) and exam findings with patient/family. I have reviewed nursing notes and appropriate previous records.  I feel the patient is safe to be discharged home without further emergent workup and can continue workup as an outpatient as needed. Discussed usual and customary return precautions. Patient/family verbalize understanding and are comfortable with this plan.  Outpatient follow-up has been provided as needed. All questions have been answered.  ____________________________________________   FINAL CLINICAL IMPRESSION(S) / ED DIAGNOSES  Final diagnoses:  Nausea vomiting and diarrhea  Gastroesophageal reflux disease, unspecified whether esophagitis present     ED Discharge Orders         Ordered    ondansetron (ZOFRAN ODT) 4 MG disintegrating tablet  Every 6 hours PRN        04/05/21 0042    pantoprazole (PROTONIX) 40 MG tablet  Daily  04/05/21 0042          *Please note:  Bonnie Ford was evaluated in Emergency Department on 04/05/2021 for the symptoms described in the history of present illness. She was evaluated in the context of the global COVID-19 pandemic, which necessitated consideration that the patient might be at risk for infection with the SARS-CoV-2 virus that causes COVID-19. Institutional protocols and algorithms that pertain to the evaluation of patients at risk for COVID-19 are in a state of rapid change based on information released by regulatory bodies including the CDC and federal and state organizations. These policies and algorithms were followed during the patient's care in the ED.  Some ED evaluations and interventions may be delayed as a result of limited staffing  during and the pandemic.*   Note:  This document was prepared using Dragon voice recognition software and may include unintentional dictation errors.   Keddrick Wyne, Layla Maw, DO 04/05/21 [phone]

## 2021-04-05 LAB — URINALYSIS, COMPLETE (UACMP) WITH MICROSCOPIC
Bacteria, UA: NONE SEEN
Bilirubin Urine: NEGATIVE
Glucose, UA: NEGATIVE mg/dL
Ketones, ur: NEGATIVE mg/dL
Leukocytes,Ua: NEGATIVE
Nitrite: NEGATIVE
Protein, ur: NEGATIVE mg/dL
Specific Gravity, Urine: 1.03 (ref 1.005–1.030)
pH: 5 (ref 5.0–8.0)

## 2021-04-05 LAB — PREGNANCY, URINE: Preg Test, Ur: NEGATIVE

## 2021-04-05 MED ORDER — PANTOPRAZOLE SODIUM 40 MG PO TBEC: 40.0000 mg | DELAYED_RELEASE_TABLET | Freq: Every day | ORAL | 1 refills | Status: DC

## 2021-04-05 MED ORDER — ONDANSETRON 4 MG PO TBDP: 4.0000 mg | ORAL_TABLET | Freq: Four times a day (QID) | ORAL | 0 refills | Status: DC | PRN

## 2021-04-05 NOTE — Discharge Instructions (Signed)
You may alternate Tylenol 1000 mg every 6 hours as needed for pain, fever and Ibuprofen 800 mg every 8 hours as needed for pain, fever.  Please take Ibuprofen with food.  Do not take more than 4000 mg of Tylenol (acetaminophen) in a 24 hour period.  You may use over-the-counter Imodium as needed for diarrhea.  We have provided you with a prescription for Zofran to take as needed for nausea and vomiting.  Please avoid NSAIDs such as aspirin (Goody powders), ibuprofen (Motrin, Advil), naproxen (Aleve) as these may worsen your symptoms.  Tylenol 1000 mg every 6 hours is safe to take as long as you have no history of liver problems (heavy alcohol use, cirrhosis, hepatitis).  Please avoid spicy, acidic (citrus fruits, tomato based sauces, salsa), greasy, fatty foods.  Please avoid caffeine and alcohol.  Smoking can also make GERD/acid reflux worse.  Over the counter medications such as TUMS, Maalox or Mylanta, pepcid, Prilosec or Nexium may help with your symptoms.  Do not take Prilosec or Nexium if you are already prescribed a proton pump inhibitor.   Steps to find a Primary Care Provider (PCP):  Call [phone] or [phone] to access "[place] Find a Doctor Service."  2.  You may also go on the [facility] website at [email or web address]

## 2021-05-31 ENCOUNTER — Ambulatory Visit: Payer: BC Managed Care – PPO | Admitting: Internal Medicine

## 2021-06-29 ENCOUNTER — Ambulatory Visit: Payer: BC Managed Care – PPO | Admitting: Nurse Practitioner

## 2021-08-01 ENCOUNTER — Emergency Department: Payer: BC Managed Care – PPO

## 2021-08-01 ENCOUNTER — Other Ambulatory Visit: Payer: Self-pay

## 2021-08-01 ENCOUNTER — Emergency Department
Admission: EM | Admit: 2021-08-01 | Discharge: 2021-08-01 | Disposition: A | Discharge status: Home / Self Care | Payer: BC Managed Care – PPO | Attending: Emergency Medicine | Admitting: Emergency Medicine

## 2021-08-01 DIAGNOSIS — I10 Essential (primary) hypertension: Secondary | ICD-10-CM | POA: Insufficient documentation

## 2021-08-01 DIAGNOSIS — R200 Anesthesia of skin: Secondary | ICD-10-CM | POA: Diagnosis not present

## 2021-08-01 DIAGNOSIS — F17200 Nicotine dependence, unspecified, uncomplicated: Secondary | ICD-10-CM | POA: Insufficient documentation

## 2021-08-01 DIAGNOSIS — R42 Dizziness and giddiness: Secondary | ICD-10-CM | POA: Diagnosis not present

## 2021-08-01 DIAGNOSIS — R202 Paresthesia of skin: Secondary | ICD-10-CM | POA: Diagnosis not present

## 2021-08-01 LAB — URINALYSIS, COMPLETE (UACMP) WITH MICROSCOPIC
Bilirubin Urine: NEGATIVE
Glucose, UA: NEGATIVE mg/dL
Hgb urine dipstick: NEGATIVE
Ketones, ur: NEGATIVE mg/dL
Nitrite: NEGATIVE
Protein, ur: NEGATIVE mg/dL
Specific Gravity, Urine: 1.025 (ref 1.005–1.030)
pH: 6.5 (ref 5.0–8.0)

## 2021-08-01 LAB — CBC
HCT: 40.2 % (ref 36.0–46.0)
Hemoglobin: 14.5 g/dL (ref 12.0–15.0)
MCH: 31.5 pg (ref 26.0–34.0)
MCHC: 36.1 g/dL — ABNORMAL HIGH (ref 30.0–36.0)
MCV: 87.4 fL (ref 80.0–100.0)
Platelets: 323 10*3/uL (ref 150–400)
RBC: 4.6 MIL/uL (ref 3.87–5.11)
RDW: 12.3 % (ref 11.5–15.5)
WBC: 9 10*3/uL (ref 4.0–10.5)
nRBC: 0 % (ref 0.0–0.2)

## 2021-08-01 LAB — BASIC METABOLIC PANEL
Anion gap: 8 (ref 5–15)
BUN: 13 mg/dL (ref 6–20)
CO2: 27 mmol/L (ref 22–32)
Calcium: 9.3 mg/dL (ref 8.9–10.3)
Chloride: 104 mmol/L (ref 98–111)
Creatinine, Ser: 0.72 mg/dL (ref 0.44–1.00)
GFR, Estimated: >60 mL/min (ref >60)
Glucose, Bld: 127 mg/dL — ABNORMAL HIGH (ref 70–99)
Potassium: 3.9 mmol/L (ref 3.5–5.1)
Sodium: 139 mmol/L (ref 135–145)

## 2021-08-01 LAB — POC URINE PREG, ED: Preg Test, Ur: NEGATIVE

## 2021-08-01 NOTE — ED Provider Notes (Signed)
[facility] Emergency Department Provider Note  ____________________________________________   Event Date/Time   First MD Initiated Contact with Patient 08/01/21 1352     (approximate)  I have reviewed the triage vital signs and the nursing notes.   HISTORY  Chief Complaint Numbness   HPI Bonnie Ford is a 25 y.o. female without known past medical history who presents for assessment of 7 or 8 months of intermittent headaches associate with blurry vision and some tingling and numbness in her bilateral lower extremities primarily in her feet.  She states she had several these episodes over the weekend and this morning and some tingling and numbness in her feet that seem to last longer than usual.  She states it has gotten better now.  She does not currently have a headache with felt dizzy today.  She states that she will sometimes have a little bit of blurry vision but her vision is not currently blurry.  She denies any injury or trauma, vertigo, painful vision, double vision, chest pain, shortness of breath, cough, vomiting, diarrhea, urinary symptoms or rash or focal weakness.  She denies any numbness or tingling in her upper extremities.  She is not had any incontinence and does not use any drugs.  No other acute concerns at this time.  She has not seen a PCP or other physician for the symptoms.         No past medical history on file.  Patient Active Problem List   Diagnosis Date Noted   Painful menstrual periods 01/02/2019    No past surgical history on file.  Prior to Admission medications   Medication Sig Start Date End Date Taking? Authorizing Provider  ondansetron (ZOFRAN ODT) 4 MG disintegrating tablet Take 1 tablet (4 mg total) by mouth every 6 (six) hours as needed for nausea or vomiting. 04/05/21   Ward, Layla Maw, DO  pantoprazole (PROTONIX) 40 MG tablet Take 1 tablet (40 mg total) by mouth daily. 04/05/21 04/05/22  Ward, Layla Maw, DO     Allergies Patient has no known allergies.  Family History  Problem Relation Age of Onset   Diabetes Mother    Thyroid disease Mother    Diabetes Sister     Social History Social History   Tobacco Use   Smoking status: Every Day   Smokeless tobacco: Never  Substance Use Topics   Alcohol use: Not Currently   Drug use: Not Currently    Review of Systems  Review of Systems  Constitutional:  Negative for chills and fever.  HENT:  Negative for sore throat.   Eyes:  Positive for blurred vision. Negative for pain.  Respiratory:  Negative for cough and stridor.   Cardiovascular:  Negative for chest pain.  Gastrointestinal:  Negative for vomiting.  Genitourinary:  Negative for dysuria.  Musculoskeletal:  Negative for myalgias.  Skin:  Negative for rash.  Neurological:  Positive for dizziness, sensory change and headaches (intermittent). Negative for seizures and loss of consciousness.  Psychiatric/Behavioral:  Negative for suicidal ideas.   All other systems reviewed and are negative.    ____________________________________________   PHYSICAL EXAM:  VITAL SIGNS: ED Triage Vitals  Enc Vitals Group     BP 08/01/21 1128 (!) 157/96     Pulse Rate 08/01/21 1128 79     Resp 08/01/21 1128 16     Temp 08/01/21 1128 98.3 F (36.8 C)     Temp Source 08/01/21 1128 Oral     SpO2 08/01/21 1128  99 %     Weight 08/01/21 1130 200 lb (90.7 kg)     Height 08/01/21 1130 5\' 7"  (1.702 m)     Head Circumference --      Peak Flow --      Pain Score 08/01/21 1129 0     Pain Loc --      Pain Edu? --      Excl. in GC? --    Vitals:   08/01/21 1128 08/01/21 1450  BP: (!) 157/96 (!) 150/88  Pulse: 79 80  Resp: 16 16  Temp: 98.3 F (36.8 C)   SpO2: 99% 99%   Physical Exam Vitals and nursing note reviewed.  Constitutional:      General: She is not in acute distress.    Appearance: She is well-developed.  HENT:     Head: Normocephalic and atraumatic.     Right Ear: External  ear normal.     Left Ear: External ear normal.     Nose: Nose normal.  Eyes:     Conjunctiva/sclera: Conjunctivae normal.  Cardiovascular:     Rate and Rhythm: Normal rate and regular rhythm.     Heart sounds: No murmur heard. Pulmonary:     Effort: Pulmonary effort is normal. No respiratory distress.     Breath sounds: Normal breath sounds.  Abdominal:     Palpations: Abdomen is soft.     Tenderness: There is no abdominal tenderness.  Musculoskeletal:     Cervical back: Neck supple.  Skin:    General: Skin is warm and dry.     Capillary Refill: Capillary refill takes less than 2 seconds.  Neurological:     Mental Status: She is alert and oriented to person, place, and time.  Psychiatric:        Mood and Affect: Mood normal.    Cranial nerves II through XII grossly intact.  No pronator drift.  No finger dysmetria.  Symmetric 5/5 strength of all extremities.  Sensation intact to light touch in all extremities.  Unremarkable unassisted gait.  2+ bilateral patellar reflexes.  Visual acuity is 20/15 bilaterally  ____________________________________________   LABS (all labs ordered are listed, but only abnormal results are displayed)  Labs Reviewed  BASIC METABOLIC PANEL - Abnormal; Notable for the following components:      Result Value   Glucose, Bld 127 (*)    All other components within normal limits  CBC - Abnormal; Notable for the following components:   MCHC 36.1 (*)    All other components within normal limits  URINALYSIS, COMPLETE (UACMP) WITH MICROSCOPIC - Abnormal; Notable for the following components:   APPearance HAZY (*)    Leukocytes,Ua TRACE (*)    Bacteria, UA RARE (*)    All other components within normal limits  POC URINE PREG, ED   ____________________________________________  EKG  ____________________________________________  RADIOLOGY  ED MD interpretation: CT head shows no evidence of ventriculomegaly, mass-effect, ischemia or other acute  intracranial process.  Official radiology report(s): CT HEAD WO CONTRAST (08/03/21)  Result Date: 08/01/2021 CLINICAL DATA:  Blurred vision and dizziness EXAM: CT HEAD WITHOUT CONTRAST TECHNIQUE: Contiguous axial images were obtained from the base of the skull through the vertex without intravenous contrast. COMPARISON:  None. FINDINGS: Brain: The brainstem, cerebellum, cerebral peduncles, thalami, basal ganglia, basilar cisterns, and ventricular system appear within normal limits. No intracranial hemorrhage, mass lesion, or acute CVA. Vascular: Faint accentuation of basilar artery density on image 33 series 4 is attributed to beam  hardening artifact. No specific vascular abnormality is identified. Skull: Unremarkable Sinuses/Orbits: Unremarkable Other: No supplemental non-categorized findings. IMPRESSION: 1. No significant intracranial abnormality is identified to explain the patient's blurred vision and dizziness. Electronically Signed   By: Gaylyn Rong M.D.   On: 08/01/2021 14:59    ____________________________________________   PROCEDURES  Procedure(s) performed (including Critical Care):  Procedures   ____________________________________________   INITIAL IMPRESSION / ASSESSMENT AND PLAN / ED COURSE      Patient presents with above-stated history and exam for assessment of several months of intermittent tingling and decreased sensation in the lower extremities associate with intermittent headaches and blurry vision.  She does not currently have significant headache or blurry vision states she felt these 2 or 3 days ago as well to headache.  She states this morning Numbness in Her Legs but Extend Better.  No Other Clear Associated Symptoms.  On Arrival She Is Hypertensive with BP of 157/96 Advised to Quit On [employer].  She has a nonfocal neurological exam abdomen is soft nontender and her spine is unremarkable.  Overall I am unsure of the etiology of her symptoms.  Differential  includes possible MS, increased intracranial pressure versus other not immediately life-threatening processes.  There is no evidence on history or exam of acute traumatic injury, CVA or acute infectious process.  Given absence of any focal deficits, waxing and waning nature of symptoms and no other associated symptoms I felt very low suspicion for cord compression, flaccid paralysis or spinal cord compression as patient has no objective deficits on exam and sensation is intact to light touch throughout the bilateral lower extremities.  Visual acuity is intact.  CT head shows no evidence of intracranial hemorrhage, ventriculomegaly or mass-effect.  I have a low suspicion that the symptoms are immediate life-threatening and given chronicity without objective deficits I think she is stable for discharge with close outpatient PCP and neurology follow-up.  Advised her of elevated blood pressure today and recommendation of this rechecked by PCP.  She is amenable this plan.  Discharged stable condition.  Strict return cautions advised and discussed.  ____________________________________________   FINAL CLINICAL IMPRESSION(S) / ED DIAGNOSES  Final diagnoses:  Paresthesia  Dizziness  Hypertension, unspecified type    Medications - No data to display   ED Discharge Orders     None        Note:  This document was prepared using Dragon voice recognition software and may include unintentional dictation errors.    Gilles Chiquito, MD 08/01/21 [phone]

## 2021-08-01 NOTE — ED Triage Notes (Signed)
Pt presents to ED with c/o of numbness and tingling to bilateral knees and below. Pt states this has been ongoing for 7-8 months and states today has lasted longer than normal. Pt denies sickness. Pt denies injury or trauma. Pt denies pain and describes it as intermittent as well as intermittent blurry vision.

## 2021-08-05 ENCOUNTER — Emergency Department
Admission: EM | Admit: 2021-08-05 | Discharge: 2021-08-05 | Discharge status: Against Medical Advice | Payer: BC Managed Care – PPO

## 2021-08-17 NOTE — Progress Notes (Signed)
Referring:  Carlean Jews, PA-C [address] [place],  [place] [address]  PCP: Carlean Jews, PA-C  Neurology was asked to evaluate Tanashia Ciesla, a 25 year old female for a chief complaint of headaches and paresthesias.  Our recommendations of care will be communicated by shared medical record.    CC:  headaches, paresthesias  HPI:  Medical co-morbidities: none  The patient presents for evaluation of headaches which have worsened over the past 6-8 months. She does have a history of bad headaches prior to this but has never formally been diagnosed with migraine. Currently headaches are near-daily. Headaches are associated with photophobia, phonophobia, nausea. She will also have 10-15 seconds of blurred vision or static in her vision with her headaches. They last for an hour with tylenol or ibuprofen which she is taking daily.   Also notes dizziness and vertigo 1-2 times per day which can last seconds to an hour. This does not seem to coincide with her headache.  Had an episode where she developed paresthesias in both legs while she was driving. This lasted for ~10 seconds and then resolved. Was not associated with headache and has not occurred since. Notes uncomfortable sensations and an urge to move her legs at night.  She presented to the ED 08/01/21 where [facility] was unremarkable.  Headache History: Onset: 6-8 months ago Triggers: unknown Most common time of day for headache to begin: any time Aura: static Location: frontal, occipital Quality/Description: pressure, throbbing Associated Symptoms:  Photophobia: yes  Phonophobia: yes  Nausea: yes Vomiting: yes Tinnitus, loss of hearing Worse with activity?: yes Duration of headaches: 1 hour with medication   Headache days per month: 30 Headache free days per month: 0  Current Treatment: Abortive Tylenol ibuprofen  Preventative none  Prior Therapies                                  Tylenol ibuprofen   Headache Risk Factors: Headache risk factors and/or co-morbidities (+) Neck Pain (+) Sleep Disorder - insomnia (+) Obesity  Body mass index is 34.77 kg/m. (+) History of Traumatic Brain Injury and/or Concussion  LABS: CBC    Component Value Date/Time   WBC 9.0 08/01/2021 1129   RBC 4.60 08/01/2021 1129   HGB 14.5 08/01/2021 1129   HCT 40.2 08/01/2021 1129   PLT 323 08/01/2021 1129   MCV 87.4 08/01/2021 1129   MCH 31.5 08/01/2021 1129   MCHC 36.1 (H) 08/01/2021 1129   RDW 12.3 08/01/2021 1129   CMP Latest Ref Rng & Units 08/01/2021 04/04/2021  Glucose 70 - 99 mg/dL 710(G) 269(S)  BUN 6 - 20 mg/dL 13 16  Creatinine 8.54 - 1.00 mg/dL 6.27 0.35  Sodium 009 - 145 mmol/L 139 142  Potassium 3.5 - 5.1 mmol/L 3.9 3.5  Chloride 98 - 111 mmol/L 104 108  CO2 22 - 32 mmol/L 27 26  Calcium 8.9 - 10.3 mg/dL 9.3 9.3  Total Protein 6.5 - 8.1 g/dL - 7.5  Total Bilirubin 0.3 - 1.2 mg/dL - 0.6  Alkaline Phos 38 - 126 U/L - 91  AST 15 - 41 U/L - 17  ALT 0 - 44 U/L - 17     IMAGING:  [facility] 08/01/21: unremarkable  Imaging independently reviewed on August 18, 2021   No current outpatient medications on file prior to visit.   No current facility-administered medications on file prior to visit.  Allergies: No Known Allergies  Family History: Migraine or other headaches in the family:  mom Aneurysms in a first degree relative:  no Brain tumors in the family:  yes, grandfather Other neurological illness in the family:   no  Past Medical History: History reviewed. No pertinent past medical history.  Past Surgical History History reviewed. No pertinent surgical history.  Social History: Social History   Tobacco Use   Smoking status: Every Day   Smokeless tobacco: Never  Substance Use Topics   Alcohol use: Not Currently   Drug use: Not Currently     ROS: Negative for fevers, chills. Positive for headaches, blurred vision. All other systems  reviewed and negative unless stated otherwise in HPI.   Physical Exam:   Vital Signs: BP 122/87   Pulse 78   Ht 5\' 7"  (1.702 m)   Wt 222 lb (100.7 kg)   BMI 34.77 kg/m  GENERAL: well appearing,in no acute distress,alert SKIN:  Color, texture, turgor normal. No rashes or lesions HEAD:  Normocephalic/atraumatic. CV:  RRR RESP: Normal respiratory effort MSK: no tenderness to palpation over occiput, neck, or shoulders  NEUROLOGICAL: Mental Status: Alert, oriented to person, place and time,Follows commands Cranial Nerves: PERRL,visual fields intact to confrontation,extraocular movements intact,facial sensation intact,no facial droop or ptosis,hearing intact to finger rub bilaterally,no dysarthria,palate elevate symmetrically,tongue protrudes midline,shoulder shrug intact and symmetric Motor: muscle strength 5/5 both upper and lower extremities,no drift, normal tone Reflexes: 2+ throughout Sensation: intact to light touch all 4 extremities Coordination: Finger-to- nose-finger intact bilaterally,Heel-to-shin intact bilaterally Gait: normal-based   IMPRESSION: 25 year old female without significant medical history who presents for evaluation of headaches, paresthesias, and blurred vision. CTH done last month was unremarkable. 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 Will start Topamax for headache prevention and Maxalt for rescue. Neck PT referral placed for cervicalgia and shoulder tension. Referral to ophtho placed for a formal eye exam for her new blurred vision.  PLAN:  Headaches and blurred vision: -Preventive: Start Topamax 25 mg QHS, uptitrate by 25 mg weekly up to 100 mg QHS -Abortive: Start Maxalt 10 mg PRN -Referral to Ophthalmology for formal eye exam -Neck PT  Paresthesias/RLS: -TSH, iron studies  Headache education was done. Discussed treatment options including preventive and acute  medications, natural supplements, and physical therapy. Discussed medication overuse headache and to limit use of acute treatments to no more than 2 days/week or 10 days/month. Discussed medication side effects, adverse reactions and drug interactions. Written educational materials and patient instructions outlining all of the above were given.  Follow-up: 3 months   25, MD 08/18/2021   3:30 PM

## 2021-08-18 ENCOUNTER — Ambulatory Visit (INDEPENDENT_AMBULATORY_CARE_PROVIDER_SITE_OTHER): Payer: BC Managed Care – PPO | Admitting: Psychiatry

## 2021-08-18 ENCOUNTER — Encounter: Payer: Self-pay | Admitting: Psychiatry

## 2021-08-18 VITALS — BP 122/87 | HR 78 | Ht 67.0 in | Wt 222.0 lb

## 2021-08-18 DIAGNOSIS — R202 Paresthesia of skin: Secondary | ICD-10-CM

## 2021-08-18 DIAGNOSIS — G43709 Chronic migraine without aura, not intractable, without status migrainosus: Secondary | ICD-10-CM | POA: Diagnosis not present

## 2021-08-18 DIAGNOSIS — H538 Other visual disturbances: Secondary | ICD-10-CM

## 2021-08-18 DIAGNOSIS — G2581 Restless legs syndrome: Secondary | ICD-10-CM | POA: Diagnosis not present

## 2021-08-18 DIAGNOSIS — M542 Cervicalgia: Secondary | ICD-10-CM

## 2021-08-18 MED ORDER — RIZATRIPTAN BENZOATE 10 MG PO TABS: 10.0000 mg | ORAL_TABLET | ORAL | 2 refills | Status: DC | PRN

## 2021-08-18 MED ORDER — TOPIRAMATE 25 MG PO TABS: ORAL_TABLET | ORAL | 3 refills | Status: DC

## 2021-08-18 NOTE — Patient Instructions (Addendum)
Start Topamax 25 mg at bedtime for one week, then take 50 mg at bedtime for one week, then take 75 mg at bedtime for one week, then take 100 mg at bedtime. Side effects may include brain fog, tingling in the fingertips, and weight loss Referral to Ophthalmology for blurred vision Blood work to check iron and thyroid Start Maxalt as needed for migraines. Take at the onset of migraine. If headache recurs or does not fully resolve, you may take a second dose after 2 hours. Please avoid taking more than 2 days per week or 10 days per month. Physical therapy referral Try to limit over the counter medication (ibuprofen, tylenol) use to 2 days per week to avoid rebound headaches

## 2021-08-19 LAB — IRON,TIBC AND FERRITIN PANEL
Ferritin: 91 ng/mL (ref 15–150)
Iron Saturation: 22 % (ref 15–55)
Iron: 81 ug/dL (ref 27–159)
Total Iron Binding Capacity: 365 ug/dL (ref 250–450)
UIBC: 284 ug/dL (ref 131–425)

## 2021-08-19 LAB — TSH: TSH: 0.475 u[IU]/mL (ref 0.450–4.500)

## 2021-10-05 ENCOUNTER — Encounter: Payer: Self-pay | Admitting: Emergency Medicine

## 2021-10-05 ENCOUNTER — Ambulatory Visit
Admission: EM | Admit: 2021-10-05 | Discharge: 2021-10-05 | Disposition: A | Discharge status: Home / Self Care | Payer: BC Managed Care – PPO | Attending: Family Medicine | Admitting: Family Medicine

## 2021-10-05 DIAGNOSIS — J208 Acute bronchitis due to other specified organisms: Secondary | ICD-10-CM

## 2021-10-05 HISTORY — DX: Migraine, unspecified, not intractable, without status migrainosus: G43.909

## 2021-10-05 MED ORDER — BENZONATATE 100 MG PO CAPS: 200.0000 mg | ORAL_CAPSULE | Freq: Three times a day (TID) | ORAL | 0 refills | Status: DC | PRN

## 2021-10-05 MED ORDER — PREDNISONE 20 MG PO TABS: 40.0000 mg | ORAL_TABLET | Freq: Every day | ORAL | 0 refills | Status: DC

## 2021-10-05 MED ORDER — ALBUTEROL SULFATE HFA 108 (90 BASE) MCG/ACT IN AERS
2.0000 | INHALATION_SPRAY | Freq: Once | RESPIRATORY_TRACT | Status: AC
  Administered 2021-10-05: 2 via RESPIRATORY_TRACT

## 2021-10-05 NOTE — ED Triage Notes (Signed)
Pt c/o cough, HA, ST, runny nose, fever x 4 days

## 2021-10-05 NOTE — ED Provider Notes (Signed)
[facility]    CSN: [id number] Arrival date & time: 10/05/21  1746      History   Chief Complaint Chief Complaint  Patient presents with   Cough   Sore Throat   Headache    HPI Shiesha Jahn is a 25 y.o. female.   HPI Patient presents with URI symptoms including cough, sore throat, fever. headache, runny nose and congestion with wheezing x4 days.  Patient is a former smoker however vapes routinely.  Endorses some intermittent shortness of breath due to coughing and wheezing.  She is afebrile.   Past Medical History:  Diagnosis Date   Migraine     Patient Active Problem List   Diagnosis Date Noted   Painful menstrual periods 01/02/2019    History reviewed. No pertinent surgical history.  OB History     Gravida  0   Para  0   Term  0   Preterm  0   AB  0   Living  0      SAB  0   IAB  0   Ectopic  0   Multiple  0   Live Births  0            Home Medications    Prior to Admission medications   Medication Sig Start Date End Date Taking? Authorizing Provider  benzonatate (TESSALON) 100 MG capsule Take 2 capsules (200 mg total) by mouth 3 (three) times daily as needed for cough. 10/05/21  Yes Bing Neighbors, FNP  predniSONE (DELTASONE) 20 MG tablet Take 2 tablets (40 mg total) by mouth daily with breakfast. 10/05/21  Yes Bing Neighbors, FNP  topiramate (TOPAMAX) 25 MG tablet Take 25 mg (1 pill) at bedtime for one week, then take 50 mg (2 pills) at bedtime for one week, then take 75 mg (3 pills) at bedtime for one week, then take 100 mg (4 pills) at bedtime. 08/18/21  Yes Ocie Doyne, MD  rizatriptan (MAXALT) 10 MG tablet Take 1 tablet (10 mg total) by mouth as needed for migraine. May repeat in 2 hours if needed 08/18/21   Ocie Doyne, MD    Family History Family History  Problem Relation Age of Onset   Diabetes Mother    Thyroid disease Mother    Multiple sclerosis Mother    Diabetes Sister    Cancer Maternal  Grandmother    Cancer Maternal Grandfather     Social History Social History   Tobacco Use   Smoking status: Former    Types: Cigarettes   Smokeless tobacco: Never  Vaping Use   Vaping Use: Every day  Substance Use Topics   Alcohol use: Not Currently   Drug use: Not Currently     Allergies   Patient has no known allergies.   Review of Systems Review of Systems Pertinent negatives listed in HPI  Physical Exam Triage Vital Signs ED Triage Vitals [10/05/21 1847]  Enc Vitals Group     BP (!) 129/92     Pulse Rate 95     Resp 16     Temp 98.6 F (37 C)     Temp Source Oral     SpO2 98 %     Weight      Height      Head Circumference      Peak Flow      Pain Score 0     Pain Loc      Pain Edu?  Excl. in GC?    No data found.  Updated Vital Signs BP (!) 129/92 (BP Location: Left Arm)   Pulse 95   Temp 98.6 F (37 C) (Oral)   Resp 16   LMP 09/19/2021 (Approximate)   SpO2 98%   Visual Acuity Right Eye Distance:   Left Eye Distance:   Bilateral Distance:    Right Eye Near:   Left Eye Near:    Bilateral Near:     Physical Exam General appearance: alert, Ill-appearing, no distress Head: Normocephalic, without obvious abnormality, atraumatic ENT: External ears normal, nares with mucosal edema, congestion, oropharynx w/o erythema or exudate Respiratory: Respirations even , unlabored, coarse lung sound, expiratory wheeze Heart: Rate and rhythm normal.  Extremities: No gross deformities Skin: Skin color, texture, turgor normal. No rashes seen  Psych: Appropriate mood and affect. Neurologic: No obvious focal abnormalities  UC Treatments / Results  Labs (all labs ordered are listed, but only abnormal results are displayed) Labs Reviewed - No data to display  EKG   Radiology No results found.  Procedures Procedures (including critical care time)  Medications Ordered in UC Medications  albuterol (VENTOLIN HFA) 108 (90 Base) MCG/ACT inhaler  2 puff (2 puffs Inhalation Given 10/05/21 1921)    Initial Impression / Assessment and Plan / UC Course  I have reviewed the triage vital signs and the nursing notes.  Pertinent labs & imaging results that were available during my care of the patient were reviewed by me and considered in my medical decision making (see chart for details).    Viral bronchitis, treat with prednisone 40 mg once daily for 5 days benzonatate as needed for cough.  Albuterol inhaler 2 puffs every 4-6 hours as needed for shortness of breath.  Strict ER precautions if symptoms worsen or do not readily improve.  Patient will perform a home COVID test if symptoms do not abruptly improve with prescribed treatment.  Final Clinical Impressions(s) / UC Diagnoses   Final diagnoses:  Acute bronchitis, viral   Discharge Instructions   None    ED Prescriptions     Medication Sig Dispense Auth. Provider   predniSONE (DELTASONE) 20 MG tablet Take 2 tablets (40 mg total) by mouth daily with breakfast. 10 tablet Bing Neighbors, FNP   benzonatate (TESSALON) 100 MG capsule Take 2 capsules (200 mg total) by mouth 3 (three) times daily as needed for cough. 40 capsule Bing Neighbors, FNP      PDMP not reviewed this encounter.   Bing Neighbors, FNP 10/13/21 2129

## 2021-11-23 ENCOUNTER — Ambulatory Visit: Payer: BC Managed Care – PPO | Admitting: Psychiatry

## 2021-11-23 ENCOUNTER — Encounter: Payer: Self-pay | Admitting: Psychiatry

## 2021-11-23 VITALS — BP 130/84 | HR 83 | Ht 67.0 in | Wt 223.0 lb

## 2021-11-23 DIAGNOSIS — G43109 Migraine with aura, not intractable, without status migrainosus: Secondary | ICD-10-CM

## 2021-11-23 DIAGNOSIS — G43909 Migraine, unspecified, not intractable, without status migrainosus: Secondary | ICD-10-CM | POA: Insufficient documentation

## 2021-11-23 MED ORDER — TOPIRAMATE 100 MG PO TABS: 100.0000 mg | ORAL_TABLET | Freq: Every day | ORAL | 2 refills | Status: DC

## 2021-11-23 NOTE — Progress Notes (Signed)
° °  CC:  headaches  Follow-up Visit  Last visit: 08/18/21  Brief HPI: 26 year old female who follows in clinic for migraines.  At her last visit she was having daily headaches. She was noted to be overusing OTCs and was counseled on medication overuse headache. She was started on Topamax for headache prevention and Maxalt for rescue. Neck PT referral was placed for neck pain. She was referred to ophtho for blurred vision.  Interval History: Since her last visit she has only had one migraine. Has a little bit of tingling in the fingertips and brain fog with Topamax, but it has not been particularly bothersome to her. Maxalt worked within 30 minutes and did not cause any side effects.  She has not had any more episodes of blurred vision or paresthesias in her legs.  Still has significant neck pain and tension. Has not been able to do physical therapy with her schedule but is planning on getting a neck massage soon.  Headache days per month: 1 Headache free days per month: 29  Current Headache Regimen: Preventative: Topamax 100 mg QHS Abortive: Maxalt 10 mg PRN   Prior Therapies                                  Tylenol Ibuprofen Topamax 100 mg QHS Maxalt 10 mg PRN  Physical Exam:   Vital Signs: BP 130/84    Pulse 83    Ht 5\' 7"  (1.702 m)    Wt 223 lb (101.2 kg)    BMI 34.93 kg/m  GENERAL:  well appearing, in no acute distress, alert  SKIN:  Color, texture, turgor normal. No rashes or lesions HEAD:  Normocephalic/atraumatic. RESP: normal respiratory effort MSK:  No gross joint deformities.   NEUROLOGICAL: Mental Status: Alert, oriented to person, place and time, Follows commands, and Speech fluent and appropriate. Cranial Nerves: PERRL, face symmetric, no dysarthria, hearing grossly intact Motor: moves all extremities equally Gait: normal-based.  IMPRESSION: 26 year old female who follows in clinic for migraines. She has had significant improvement in headache frequency  since starting Topamax. Maxalt works well for rescue. Will continue her current regimen for now. Discussed that if headaches remain stable over the next few months we can discuss trying to wean the Topamax.  PLAN: -Preventive: Continue Topamax 100 mg QHS -Rescue: Continue Maxalt 10 mg PRN  Follow-up: 1 year or sooner if needed  I spent a total of 20 minutes on the date of the service. Discussed medication side effects, adverse reactions and drug interactions. Written educational materials and patient instructions outlining all of the above were given.  Genia Harold, MD 11/23/21 2:50 PM

## 2022-02-22 NOTE — Progress Notes (Deleted)
? ?  CC:  headaches ? ?Follow-up Visit ? ?Last visit: 11/23/21 ? ?Brief HPI: ?26 year old female who follows in clinic for migraines. ? ?At her last visit she stable on Topamax and Maxalt. ?Interval History: ?Since her last visit*** ? ? ?Headache days per month: *** ?Headache free days per month: *** ?Headache severity: *** ? ?Current Headache Regimen: ?Preventative: *** ?Abortive: *** ? ?# of doses of abortive medications per month: *** ? ?Prior Therapies                                  ?Tylenol ?Ibuprofen ?Topamax 100 mg QHS ?Maxalt 10 mg PRN ? ?Physical Exam:  ? ?Vital Signs: ?There were no vitals taken for this visit. ?GENERAL:  well appearing, in no acute distress, alert  ?SKIN:  Color, texture, turgor normal. No rashes or lesions ?HEAD:  Normocephalic/atraumatic. ?RESP: normal respiratory effort ?MSK:  No gross joint deformities.  ? ?NEUROLOGICAL: ?Mental Status: Alert, oriented to person, place and time, Follows commands, and Speech fluent and appropriate. ?Cranial Nerves: PERRL, face symmetric, no dysarthria, hearing grossly intact ?Motor: moves all extremities equally ?Gait: normal-based. ? ?IMPRESSION: ?*** ? ?PLAN: ?*** ? ? ?Follow-up: *** ? ?I spent a total of *** minutes on the date of the service. Headache education was done. Discussed lifestyle modification including increased oral hydration, decreased caffeine, exercise and stress management. Discussed treatment options including preventive and acute medications, natural supplements, and infusion therapy. Discussed medication overuse headache and to limit use of acute treatments to no more than 2 days/week or 10 days/month. Discussed medication side effects, adverse reactions and drug interactions. Written educational materials and patient instructions outlining all of the above were given. ? ?Ocie Doyne, MD ? ?

## 2022-02-23 ENCOUNTER — Encounter: Payer: Self-pay | Admitting: Psychiatry

## 2022-02-23 ENCOUNTER — Ambulatory Visit: Payer: BC Managed Care – PPO | Admitting: Psychiatry

## 2022-03-01 ENCOUNTER — Encounter: Payer: BC Managed Care – PPO | Admitting: Radiology

## 2022-03-01 DIAGNOSIS — Z0289 Encounter for other administrative examinations: Secondary | ICD-10-CM

## 2022-04-20 ENCOUNTER — Ambulatory Visit: Payer: BC Managed Care – PPO | Admitting: Radiology

## 2022-04-20 ENCOUNTER — Other Ambulatory Visit (HOSPITAL_COMMUNITY)
Admission: RE | Admit: 2022-04-20 | Discharge: 2022-04-20 | Disposition: A | Discharge status: Home / Self Care | Payer: BC Managed Care – PPO | Source: Ambulatory Visit | Attending: Radiology | Admitting: Radiology

## 2022-04-20 ENCOUNTER — Other Ambulatory Visit: Payer: Self-pay | Admitting: *Deleted

## 2022-04-20 ENCOUNTER — Encounter: Payer: Self-pay | Admitting: Radiology

## 2022-04-20 ENCOUNTER — Encounter: Payer: BC Managed Care – PPO | Admitting: Radiology

## 2022-04-20 VITALS — BP 118/84 | Ht 67.0 in | Wt 210.0 lb

## 2022-04-20 DIAGNOSIS — Z113 Encounter for screening for infections with a predominantly sexual mode of transmission: Secondary | ICD-10-CM

## 2022-04-20 DIAGNOSIS — Z01419 Encounter for gynecological examination (general) (routine) without abnormal findings: Secondary | ICD-10-CM | POA: Diagnosis not present

## 2022-04-20 DIAGNOSIS — Z30017 Encounter for initial prescription of implantable subdermal contraceptive: Secondary | ICD-10-CM

## 2022-04-20 NOTE — Progress Notes (Signed)
   Bonnie Ford [birth year] [id number]   History:  26 y.o. G0 presents for annual exam. Interested in Nexplanon for [facility]. Hx of ovarian and nabothian cyst. Has some right sided pain with ovulation.  Gynecologic History Patient's last menstrual period was 04/15/2022 (approximate). Period Cycle (Days): 28 Period Duration (Days): varies Period Pattern: Regular Menstrual Flow:  (flow varies) Dysmenorrhea: (!) Moderate Dysmenorrhea Symptoms: Cramping Contraception/Family planning: none Sexually active: yes Last Pap: 2020. Results were: normal   Obstetric History OB History  Gravida Para Term Preterm AB Living  0 0 0 0 0 0  SAB IAB Ectopic Multiple Live Births  0 0 0 0 0     The following portions of the patient's history were reviewed and updated as appropriate: allergies, current medications, past family history, past medical history, past social history, past surgical history, and problem list.  Review of Systems Pertinent items noted in HPI and remainder of comprehensive ROS otherwise negative.   Past medical history, past surgical history, family history and social history were all reviewed and documented in the EPIC chart.   Exam:  Vitals:   04/20/22 1312  BP: 118/84  Weight: 210 lb (95.3 kg)  Height: 5\' 7"  (1.702 m)   Body mass index is 32.89 kg/m.  General appearance:  Normal Thyroid:  Symmetrical, normal in size, without palpable masses or nodularity. Respiratory  Auscultation:  Clear without wheezing or rhonchi Cardiovascular  Auscultation:  Regular rate, without rubs, murmurs or gallops  Edema/varicosities:  Not grossly evident Abdominal  Soft,nontender, without masses, guarding or rebound.  Liver/spleen:  No organomegaly noted  Hernia:  None appreciated  Skin  Inspection:  Grossly normal Breasts: Examined lying and sitting. Bilateral nipple piercings.  Right: Without masses, retractions, nipple discharge or axillary adenopathy.   Left: Without  masses, retractions, nipple discharge or axillary adenopathy. Genitourinary   Inguinal/mons:  Normal without inguinal adenopathy  External genitalia:  Normal appearing vulva with no masses, tenderness, or lesions  BUS/Urethra/Skene's glands:  Normal without masses or exudate  Vagina:  Normal appearing with normal color and discharge, no lesions  Cervix:  Normal appearing without discharge or lesions  Uterus:  Normal in size, shape and contour.  Mobile, nontender  Adnexa/parametria:     Rt: Normal in size, without masses or tenderness.   Lt: Normal in size, without masses or tenderness.  Anus and perineum: Normal   Patient informed chaperone available to be present for breast and pelvic exam. Patient has requested no chaperone to be present. Patient has been advised what will be completed during breast and pelvic exam.   Assessment/Plan:   1. Well woman exam with routine gynecological exam - reassured no nabothian cyst seen - no adnexal or ovarian masses appreciated  - Cytology - PAP( [place])  2. Screening for STDs (sexually transmitted diseases) With pap  3. Nexplanon insertion Schedule ASAP - Remove and insert drug implant; Future      Discussed SBE, pap and STI  screening as directed/appropriate. Recommend of exercise weekly, including weight bearing exercise. Encouraged the use of seatbelts and sunscreen. Return in 1 year for annual or as needed.   B WHNP-BC 1:31 PM 04/20/2022

## 2022-04-21 LAB — CYTOLOGY - PAP
Chlamydia: NEGATIVE
Comment: NEGATIVE
Comment: NEGATIVE
Comment: NORMAL
Diagnosis: NEGATIVE
Neisseria Gonorrhea: NEGATIVE
Trichomonas: NEGATIVE

## 2022-04-22 ENCOUNTER — Encounter: Payer: Self-pay | Admitting: Radiology

## 2022-04-22 ENCOUNTER — Ambulatory Visit (INDEPENDENT_AMBULATORY_CARE_PROVIDER_SITE_OTHER): Payer: BC Managed Care – PPO | Admitting: Radiology

## 2022-04-22 VITALS — BP 116/76

## 2022-04-22 DIAGNOSIS — Z30017 Encounter for initial prescription of implantable subdermal contraceptive: Secondary | ICD-10-CM

## 2022-04-22 DIAGNOSIS — Z01812 Encounter for preprocedural laboratory examination: Secondary | ICD-10-CM

## 2022-04-22 LAB — PREGNANCY, URINE: Preg Test, Ur: NEGATIVE

## 2022-04-22 NOTE — Progress Notes (Signed)
     26 y.o. G0P0000 female presents for Nexplanon insertion.  She has been counseled about alternative types of contraception and has decided this long acting method is the best for her.  Procedure, risks and benefits have all been explained.  She has the following questions today:  none.     LMP:  04/15/22  Pregnancy test: neg  After all questions were answered, consent was obtained.    Past Medical History:  Diagnosis Date   Migraine     No past surgical history on file.  Current Outpatient Medications on File Prior to Visit  Medication Sig Dispense Refill   rizatriptan (MAXALT) 10 MG tablet Take 1 tablet (10 mg total) by mouth as needed for migraine. May repeat in 2 hours if needed 10 tablet 2   topiramate (TOPAMAX) 100 MG tablet Take 1 tablet (100 mg total) by mouth at bedtime. 30 tablet 2   No current facility-administered medications on file prior to visit.   No Known Allergies  Vitals:   04/22/22 1159  BP: 116/76   Physical Exam  Procedure: Patient placed supine on exam table with her left arm flexed at the elbow. The location for insertion site was identified 8-10 cm from epicondyle and 3-5 cm posterior to the sulcus.  In sterile fashion the area was cleansed with Betadine x 3. Insertion site and path of insertion was anesthetized with 1% Lidocaine without epinephrine, 2 cc total.  Using Nexplanon device (and after confirming presence of rod in device), skin was pierced and then elevated along insertion path, passing device just under the skin.  Rod released and device inserted.  Rod palpated easily by myself and the patient.  Band aid and pressure bandage were applied over the site.   Chaperone, Raynelle Fanning, CMA was present for the entirety of the procedure  Assessment/Plan:   1. Nexplanon insertion  - Insertion of implanon rod  2. Encounter for preprocedural laboratory examination  - Pregnancy, urine   Post procedure instructions reviewed with pt.  Questions  answered.  Pt knows to call with any concerns or questions.  Pt is aware removal is due by 3 calendar years from today.

## 2022-05-02 DIAGNOSIS — B86 Scabies: Secondary | ICD-10-CM | POA: Diagnosis not present

## 2022-05-14 DIAGNOSIS — M25562 Pain in left knee: Secondary | ICD-10-CM | POA: Diagnosis not present

## 2022-07-19 ENCOUNTER — Encounter: Payer: Self-pay | Admitting: Radiology

## 2022-07-19 ENCOUNTER — Ambulatory Visit: Payer: BC Managed Care – PPO | Admitting: Radiology

## 2022-07-19 VITALS — BP 124/86

## 2022-07-19 DIAGNOSIS — Z113 Encounter for screening for infections with a predominantly sexual mode of transmission: Secondary | ICD-10-CM | POA: Diagnosis not present

## 2022-07-19 DIAGNOSIS — R3915 Urgency of urination: Secondary | ICD-10-CM

## 2022-07-19 DIAGNOSIS — N898 Other specified noninflammatory disorders of vagina: Secondary | ICD-10-CM | POA: Diagnosis not present

## 2022-07-19 LAB — WET PREP FOR TRICH, YEAST, CLUE

## 2022-07-19 MED ORDER — METRONIDAZOLE 500 MG PO TABS: 500.0000 mg | ORAL_TABLET | Freq: Two times a day (BID) | ORAL | 0 refills | Status: DC

## 2022-07-19 NOTE — Progress Notes (Signed)
      Subjective: Bonnie Ford is a 26 y.o. female who complains of recurrent UTI sx. Has used OTC cranberry to treat. For the past week she has noticed urinary urgency, frequency without dysuria. Does have vaginal discharge with an odor. Not using condoms with the female partner (couple).    - Sexually active with 2 partner(s) - Last sexual encounter: 1 week ago -Contraception: Nexplanon  Review of Systems  Past Medical History:  Diagnosis Date   Migraine       Objective:  Today's Vitals   07/19/22 1005  BP: 124/86   There is no height or weight on file to calculate BMI.   -General: no acute distress -Vulva: without lesions or discharge -Vagina: discharge present, aptima swab and wet prep obtained -Cervix: no lesion or discharge, no CMT -Perineum: no lesions -Uterus: Mobile, non tender -Adnexa: no masses or tenderness  Urine dipstick shows positive for RBC's, positive for leukocytes, positive for urobilinogen, and positive for ketones.  Micro exam: 40-60 WBC's per HPF, 0-2 RBC's per HPF, and moderate+ bacteria.   Microscopic wet-mount exam shows clue cells.   Chaperone offered and declined.  Assessment:/Plan:   1. Urinary urgency - Urinalysis,Complete w/RFL Culture - Begin D-Mannose for prevention   2. Vaginal discharge - WET PREP FOR TRICH, YEAST, CLUE +Bv Rx sent for flagyl  3. Screening for STDs (sexually transmitted diseases) - SURESWAB CT/NG/T. vaginalis    Will contact patient with results of testing completed today. Avoid intercourse until symptoms are resolved. Safe sex encouraged. Avoid the use of soaps or perfumed products in the peri area. Avoid tub baths and sitting in sweaty or wet clothing for prolonged periods of time.

## 2022-07-20 LAB — SURESWAB CT/NG/T. VAGINALIS
C. trachomatis RNA, TMA: NOT DETECTED
N. gonorrhoeae RNA, TMA: NOT DETECTED
Trichomonas vaginalis RNA: NOT DETECTED

## 2022-07-22 ENCOUNTER — Other Ambulatory Visit: Payer: Self-pay | Admitting: Radiology

## 2022-07-22 DIAGNOSIS — B9620 Unspecified Escherichia coli [E. coli] as the cause of diseases classified elsewhere: Secondary | ICD-10-CM

## 2022-07-22 LAB — URINE CULTURE
MICRO NUMBER:: 13871936
SPECIMEN QUALITY:: ADEQUATE

## 2022-07-22 LAB — URINALYSIS, COMPLETE W/RFL CULTURE
Casts: NONE SEEN /LPF
Crystals: NONE SEEN /HPF
Glucose, UA: NEGATIVE
Hyaline Cast: NONE SEEN /LPF
Nitrites, Initial: NEGATIVE
Protein, ur: NEGATIVE
Specific Gravity, Urine: 1.025 (ref 1.001–1.035)
Yeast: NONE SEEN /HPF
pH: 5.5 (ref 5.0–8.0)

## 2022-07-22 LAB — CULTURE INDICATED

## 2022-07-22 MED ORDER — NITROFURANTOIN MONOHYD MACRO 100 MG PO CAPS: 100.0000 mg | ORAL_CAPSULE | Freq: Two times a day (BID) | ORAL | 0 refills | Status: DC

## 2022-08-11 NOTE — Telephone Encounter (Signed)
Bonnie Ford with pt, she reports that she is in [place] today and probably wont be back in town until close to the time that we close. Pt advised that I will have schedulers give her a call to get her scheduled for her next convenient OV. However, bleeding and pain precautions reviewed with pt until she is able to come to our office to be examined. She voiced understanding. Msg sent to scheduling.  Pt is scheduled to see ML tomorrow @ 11.

## 2022-08-11 NOTE — Telephone Encounter (Signed)
Please put her on Bonnie Ford schedule today (she has openings)

## 2022-08-12 ENCOUNTER — Ambulatory Visit: Payer: Self-pay | Admitting: Obstetrics & Gynecology

## 2022-08-12 DIAGNOSIS — Z0289 Encounter for other administrative examinations: Secondary | ICD-10-CM

## 2022-08-19 IMAGING — CT CT HEAD W/O CM
3 series · 16 of 47 positions shown, 19 images · non-contrast
Comparison: None.

CLINICAL DATA: Blurred vision and dizziness

EXAM:
CT HEAD WITHOUT CONTRAST
TECHNIQUE: Contiguous axial images were obtained from the base of the skull
through the vertex without intravenous contrast.

[Series 2: head wo · axial · 0.41mm/px · z∈[-172,-42]mm · 10 of 32 slices shown, 13 images]
[im 3/32  brain]
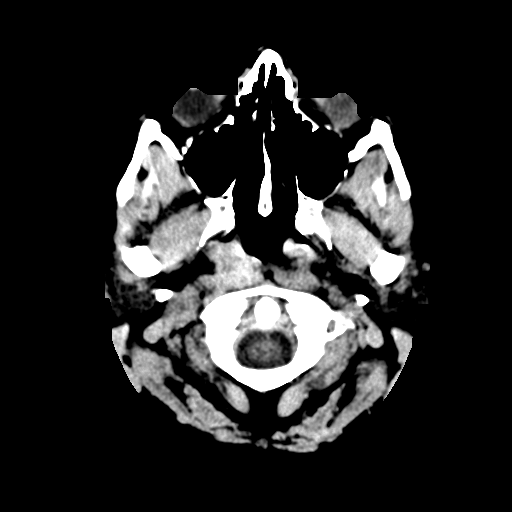
[im 3/32  bone]
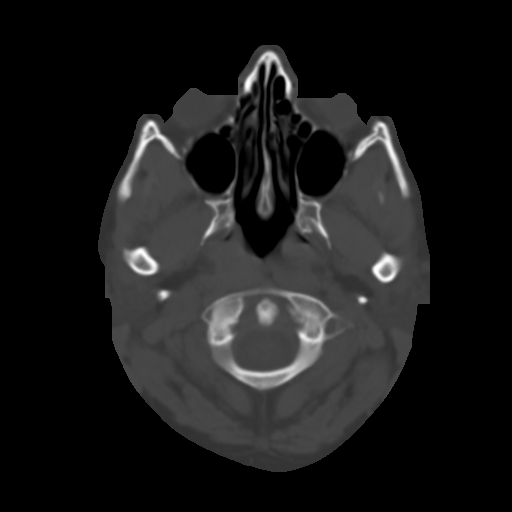
[im 6/32  brain]
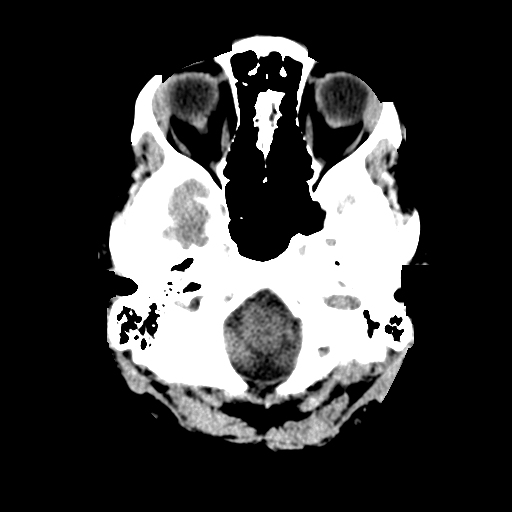
[im 9/32  brain]
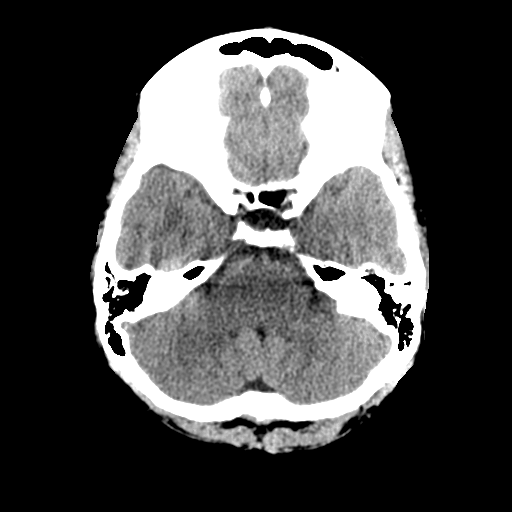
[im 11/32  brain]
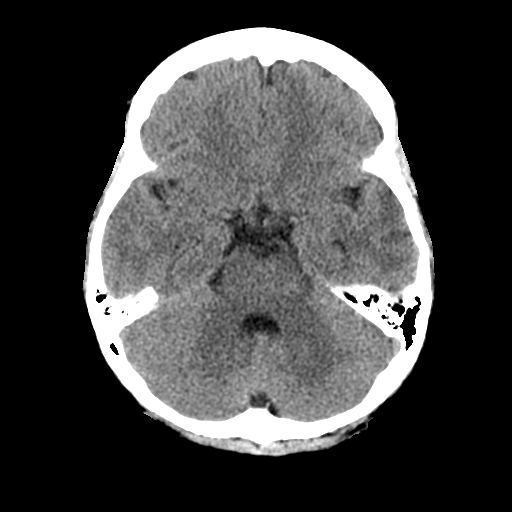
[im 14/32  brain]
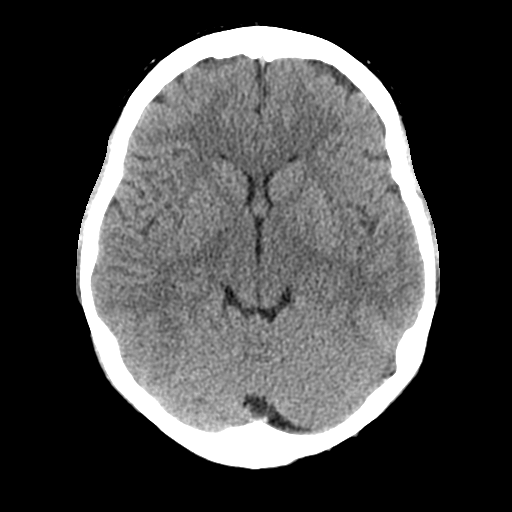
[im 14/32  bone]
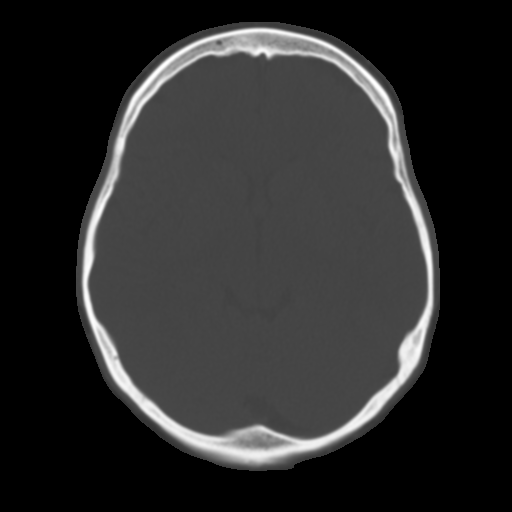
[im 18/32  brain]
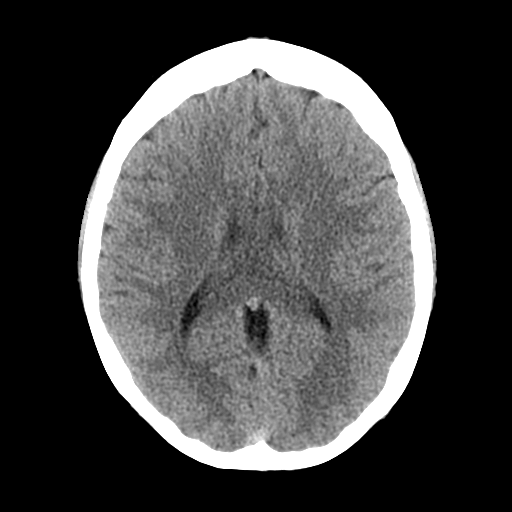
[im 21/32  brain]
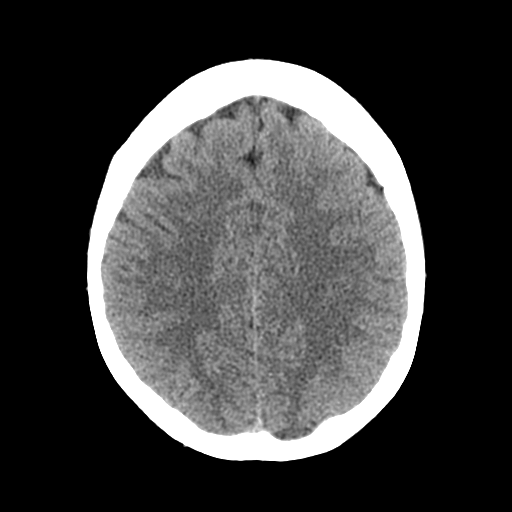
[im 24/32  brain]
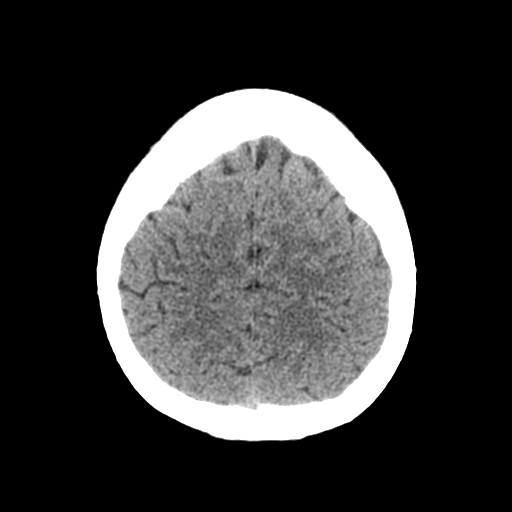
[im 26/32  brain]
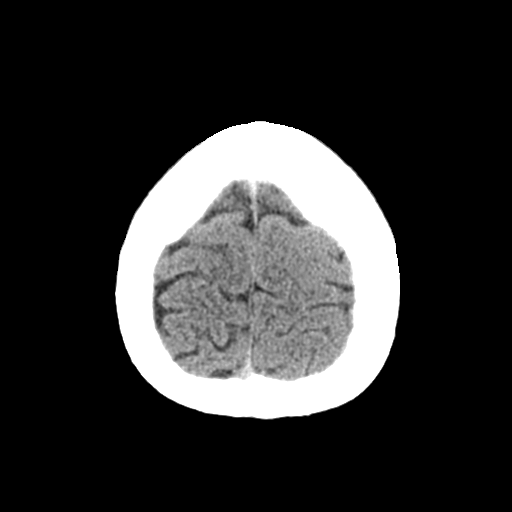
[im 26/32  bone]
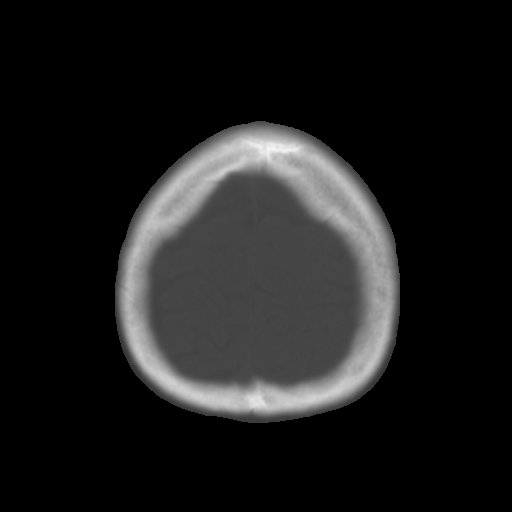
[im 29/32  brain]
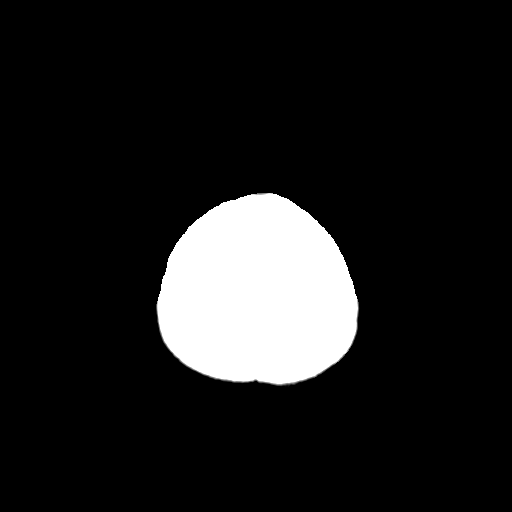

[Series 4: coronal soft tissue · coronal · 0.33mm/px · 3 of 65 slices shown]
[im 22/65  brain]
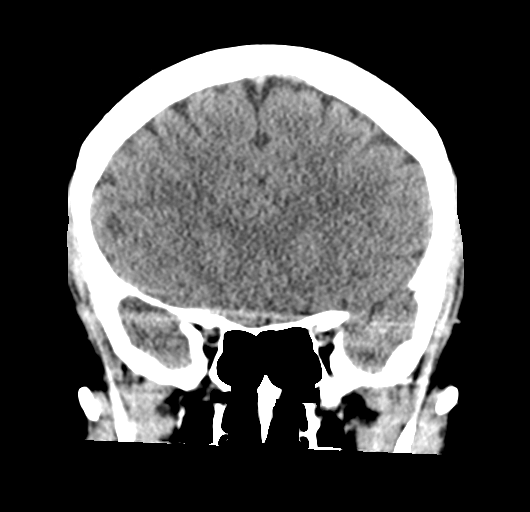
[im 29/65  brain]
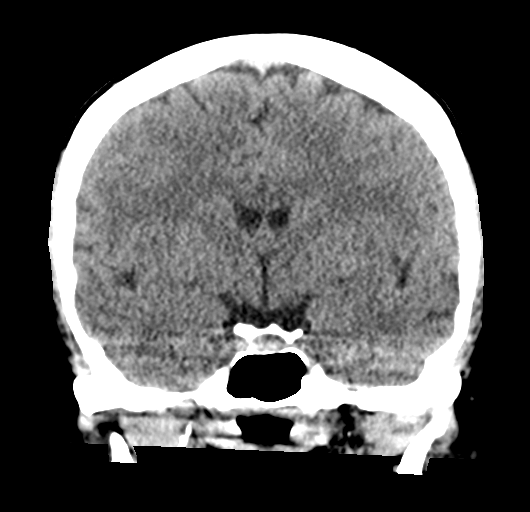
[im 36/65  brain]
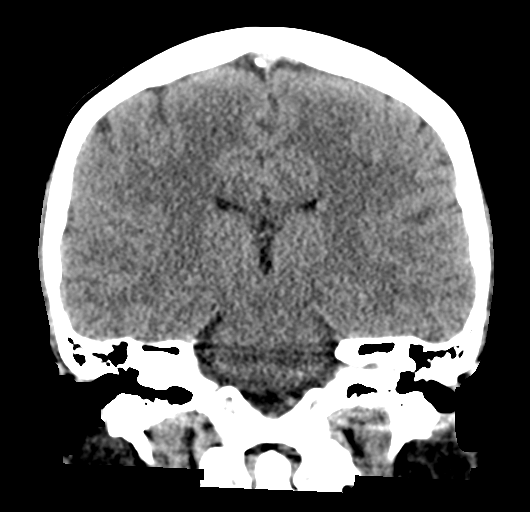

[Series 5: sagittal soft tissue · sagittal · 0.33mm/px · 3 of 59 slices shown]
[im 20/59  brain]
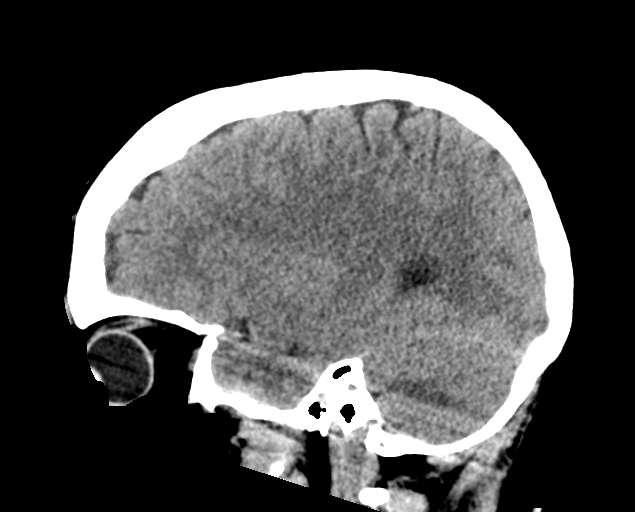
[im 30/59  brain]
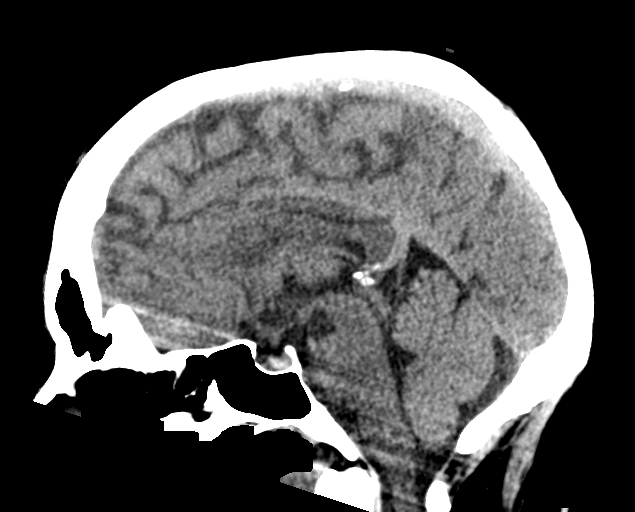
[im 39/59  brain]
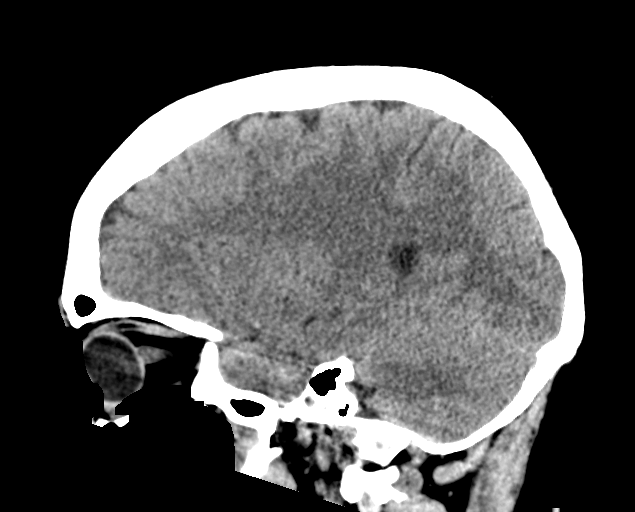

[16 of 47 positions shown; findings below may reference images not displayed]

FINDINGS: Brain: The brainstem, cerebellum, cerebral peduncles, thalami, basal
ganglia, basilar cisterns, and ventricular system appear within
normal limits. No intracranial hemorrhage, mass lesion, or acute
CVA.

Vascular: Faint accentuation of basilar artery density on image 33
series 4 is attributed to beam hardening artifact. No specific
vascular abnormality is identified.

Skull: Unremarkable

Sinuses/Orbits: Unremarkable

Other: No supplemental non-categorized findings.
IMPRESSION: 1. No significant intracranial abnormality is identified to explain
the patient's blurred vision and dizziness.

## 2022-10-12 NOTE — Telephone Encounter (Signed)
Bonnie Ford patient was seen in 07/2022 for vaginal discharge.

## 2022-10-13 NOTE — Telephone Encounter (Signed)
Schedule for visit. 

## 2022-10-24 ENCOUNTER — Ambulatory Visit (HOSPITAL_COMMUNITY)
Admission: EM | Admit: 2022-10-24 | Discharge: 2022-10-24 | Disposition: A | Discharge status: Home / Self Care | Payer: BC Managed Care – PPO

## 2022-10-24 ENCOUNTER — Encounter: Payer: Self-pay | Admitting: Radiology

## 2022-10-24 ENCOUNTER — Ambulatory Visit (INDEPENDENT_AMBULATORY_CARE_PROVIDER_SITE_OTHER): Payer: BC Managed Care – PPO | Admitting: Radiology

## 2022-10-24 VITALS — BP 134/80

## 2022-10-24 DIAGNOSIS — R4586 Emotional lability: Secondary | ICD-10-CM | POA: Diagnosis not present

## 2022-10-24 DIAGNOSIS — F319 Bipolar disorder, unspecified: Secondary | ICD-10-CM | POA: Diagnosis not present

## 2022-10-24 DIAGNOSIS — Z3046 Encounter for surveillance of implantable subdermal contraceptive: Secondary | ICD-10-CM

## 2022-10-24 NOTE — Discharge Instructions (Addendum)
It is imperative that you follow through with treatment recommendations within 5-7 days from the day of discharge to mitigate further risk to your safety and overall mental well-being.  A list of outpatient therapy and psychiatric providers for medication management has been provided below to get you started in finding the right provider for you.         In case of an urgent emergency, you have the option of contacting the Mobile Crisis Unit with [facility] at [phone].    [facility] [address] [place], [place], [address] [phone] phone (Medicare, Private insurance except Tricare, [place], and [facility])  [facility] - 6-8 month wait list for therapy; 1-2 weeks for medication management. [address] [place], [place], [address] [address] phone ([address], [employer] [address] - [place], [address], [place], [place], Friday Health Plans, [address], BCBS Healthy [place], [place], [address], [place], [place], [place], [place], [place], [facility], [employer]co Corporation, [place])  [employer] [phone] [address] [place], [place], [address] [phone] phone [phone] phone [phone] fax  [facility] [address] [place], [place], [address] [phone] phone (Call to confirm insurance coverage) Consultation & Support Services     o Drop-In Hours: 1:00 PM to 5:00 PM     o Days: Monday - Thursday  Crisis Services (24/7)    [facility] [address] [place], [place], [address] [phone] phone [email or web address]  (to complete the intake form and upload ID and insurance cards)  [facility] [address] [place], [place], [address] [phone] phone ([address], [address], [place], [address] Complete Health, [place], [place], [place], UHC, [place],  and certain Medicaid plans)  [facility] [phone] [address] [place], [place], [address] [phone] phone [phone] fax (Medicaid, Medicare, Self-pay, call about other insurance coverage)  [facility] (age 29+) [address] [place], [place], [address] [phone] hone [phone] fax ([place], [address], [place], [place], [place], [address], [place], [place], [place], [place], certain [employer], [facility], UMR)  [employer] [address] [place], [place], [address] [phone] phone (Medicare, Medicaid, Artemio Aly, call about other insurance coverage)  [facility] [address] [place], [place], [address] [phone] phone [phone] fax (Call [phone] to see what insurance is accepted) Archer Asa, MD specializes in geropsych)  [facility], [facility]  (medication management only) [address] [place], [place], [address] [phone] phone [phone] fax ([address], Medicaid, [place], [place], [place], [place], [place], [place], [place])  [facility] (medication management only) [address], [address] [place], [place], [address] [phone] phone [phone] fax ([address], Medicare, [place], [place], Tricare [place])  [facility] [address] [place], [place], [address] [phone] phone [phone] fax ([address] [place], [place], [place], [place], [facility], [facility] Medicaid/[place] Health Choice)  [facility] [place], [employer]. [address] [place], [place], [address] [phone] phone [phone] fax (Medicare, Medicaid, [facility])  [facility] [address] [place], [place] [address] [phone] phone ([address] [place], [place], [place], [place], Medicare, [place], [facility]) Does genetic testing for medications; does transcranial magnetic stimulation along with basic services)  [facility] [address] [place], [place], [address] [phone] phone (Call about insurance coverage)  [facility] [address] [place], [place], [address] [phone] phone [phone] fax (Call about insurance coverage)  [facility] [address]  [place], [place], [address] [phone] phone [phone] fax (Call about insurance coverage)  [employer] [phone] [address] [place], [address] [phone] phone (Medicaid, Tricare, [place], [place], [place])  [employer] [address] [place], [place], [address] [phone] phone (Medicaid, Medicare, call about other insurance coverage)  [facility] [address] [place], [place], [address] [phone] phone [phone] fax (Medicaid, Medicare, Tricare, call about other insurance coverage)  [facility] [address], [place], [address] [phone] phone ([address], [place], [place], [place], [place] types - Alliance, Secretary/administrator, Partners, [place], [place] Health Choice, Healthy [place], [place], [place], and Complete)  [facility] [address] [place], [place], [address] [phone] phone Completely online treatment platform Contact: Personal assistant - [employer] Specialist [phone] phone [phone] fax ([address] [place], [place], Friday Health Plan, [place], [place], [place], [place], [place], [place])         [facility] [address] [place], [place], [address] [phone] phone (BCBS, [place]; for individuals without insurance, please call and speak to our staff)  [facility], [facility] [address] [place], [place], [address] [phone] phone (Aetna/Aetna DISH, [facility]HS/[facility], [place], [place], [address], [place], [place], Health Choice, Health Net, [place], MHN/Health Net,  [place], [place], [place])  [facility] [address] [place], [place], [address] [phone] phone - MUST CALL TO COMPLETE A PHONE ASSESSMENT [phone] fax PHP - 9am-3pm, 5 days/week (in-person); IOP - 9am-12pm, 3 days/week (virtual; you choose the days) (BCBS, [place], [place], [place], [place], [place])  [facility] [address], [place], [address] [phone] phone [phone] fax (Medicare, TRICARE, call to verify other insurance coverage)  [facility] [address] [place], [place], [address] [phone] phone [phone] fax Also does DBT, Trauma Focused CBT, Trauma Groups, Support Groups (BCBS, self-pay, or scholarship)  [facility] [address] [place], [place], [address] [phone] phone Also does DBT, Radical Open DBT, EMDR, and [place]ins[place]ting ([address] UHC, [place], Berneta Levins, Cigna)  [facility] [address], [place], [address]  [phone] phone [phone] fax Also does EMDR, Child First, and trauma work (Medicaid, [place], [facility], [place], [place] Health Choice, reduced rates for self-pay)  [employer] (DBT Center) [address] [place], [place], [address] [phone] phone [phone] fax  [facility] (ADULTS ONLY) [address] [place], [place], [address] [phone] phone (BCBS-[place], [place], [place], [place], [place], Friday Health, [facility])  [facility] (FEMALES ONLY; FAITH BASED) [address] [place], [place], [address] [phone] phone (Sliding fee scale, [place], [facility], [place], Alliance)  [facility] [address] [place], [place], [address] [phone] phone (Medicaid - [place], [place], and Innovations; Yoakum, [facility], Becton, Dickinson and Company, [place], [place], [place], [address] Preferred, Tricare/[place], [place])  [facility] [address]   [address] [place], [place], [address]   [place], [place], [address] [phone] phone    [phone] phone [phone] fax    [phone] fax (Call to verify insurance coverage)  [facility] [address] [place], [place], [address] [phone] phone Also does DBT and Addiction (BCBS, [place], [place], and self-pay)  [employer]  ([employer] ONLY) [address] [place], [place], [address] [phone] phone ([address] [place], [place], Sprint Nextel Corporation, [place], Har[employer] Entertainment, Advanced Micro Devices, Tricare, Magna, [facility])  [facility] [address] [place], [place], [address] [phone] phone (Call to verify  insurance coverage)  [facility] [address]   [place], [place], [address]  [place], [place], [address] [phone] phone   [phone] phone (Call to verify insurance coverage)  [address] [place], [place] [address] [phone]  [facility] [place] [id number] [place], [place] [address] [phone]
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
## 2022-10-24 NOTE — Progress Notes (Signed)
   26 y.o. G0P0000 female presents for Nexplanon removal.  Reason for removal mood changes.  She has a female partner no need for future contraception.  Procedure, risks and benefits have all been explained.      After all questions were answered, consent was obtained.    Past Medical History:  Diagnosis Date   Migraine     History reviewed. No pertinent surgical history.  Current Outpatient Medications on File Prior to Visit  Medication Sig Dispense Refill   CRANBERRY PO Take by mouth.     etonogestrel (NEXPLANON) 68 MG IMPL implant 1 each by Subdermal route once. INSERTED LEFT ARM 04/22/2022     metroNIDAZOLE (FLAGYL) 500 MG tablet Take 1 tablet (500 mg total) by mouth 2 (two) times daily. 14 tablet 0   nitrofurantoin, macrocrystal-monohydrate, (MACROBID) 100 MG capsule Take 1 capsule (100 mg total) by mouth 2 (two) times daily. 14 capsule 0   rizatriptan (MAXALT) 10 MG tablet Take 1 tablet (10 mg total) by mouth as needed for migraine. May repeat in 2 hours if needed 10 tablet 2   topiramate (TOPAMAX) 100 MG tablet Take 1 tablet (100 mg total) by mouth at bedtime. 30 tablet 2   No current facility-administered medications on file prior to visit.   No Known Allergies  Vitals:   10/24/22 1141  BP: 134/80   Physical Exam  Procedure: Patient placed supine on exam table with her left arm flexed at the elbow. The prior insertion site was located and the Nexplanon rod was palpated.  Area cleansed with Betadine x 3 and draped in normal sterile fashion.  Insertion site and surrounding tissue anesthetized with 1% Lidocaine without epinephrine, 2cc total used.  Small incision made with #11 blade.  Nexplanon removed without difficulty.  Steri-strips were applied and pressure dressing placed over the site.  Entire procedure performed with sterile technique.  Pt tolerated procedure well. Chaperone, Rosalyn Charters was present for the entirety of the procedure  Assessment/Plan:    1.  Encounter for Nexplanon removal Nexplanon removed without difficulty Aftercare reviewed

## 2022-10-24 NOTE — BH Assessment (Addendum)
Pt reports she just got her birth control [facility]) taken out about 30 min ago and reports she had it in for six months.  Pt reports the [facility] could have intensify her mood swings which has been going on for couple of years and getting worse or there past three months. Pt reports she feeling happy two-three days followed by feeling "super low" for a few days followed by "big mood swings" where she can't let it go of things and it takes her days to get over things that are bothering her. Pt reports family history of bipolar and manic depression and anxiety Pt does not have outpt services. No hx of inpt treatment. Pt reports she was taking Zoloft for anxiety and depression when she was 97-6 years old for about two months. Pt denies SI, HI, AVH, substance use. Pt reports mood swings affecting her current relationship but denies any other stressors.

## 2022-10-24 NOTE — BH Assessment (Signed)
LCSW Progress Note   Per Dr. Park Pope, this pt does not require psychiatric hospitalization at this time.  Pt is psychiatrically cleared.  Discharge instructions includes several resources for basic mental health services such as therapy and medication management.  EDP Dr. Park Pope, has been notified.  Hansel Starling, MSW, LCSW [facility] [phone] or [phone]

## 2022-11-24 ENCOUNTER — Ambulatory Visit: Payer: BC Managed Care – PPO | Admitting: Radiology

## 2022-11-25 ENCOUNTER — Ambulatory Visit: Payer: BC Managed Care – PPO | Admitting: Radiology

## 2023-01-02 ENCOUNTER — Telehealth: Payer: Self-pay

## 2023-01-02 NOTE — Telephone Encounter (Signed)
Note from [place] in scheduling:  " Patient wants to come in for iud insert/ okay to schedule if yes please enter order or does she need office to discuss iud?"

## 2023-01-03 NOTE — Telephone Encounter (Signed)
Needs consult

## 2023-01-03 NOTE — Telephone Encounter (Signed)
Claudia advised. She will call and schedule patient.

## 2023-01-04 ENCOUNTER — Ambulatory Visit: Payer: BC Managed Care – PPO | Admitting: Radiology

## 2024-01-04 ENCOUNTER — Ambulatory Visit: Payer: Self-pay

## 2024-01-20 ENCOUNTER — Ambulatory Visit: Payer: Self-pay

## 2024-01-31 ENCOUNTER — Ambulatory Visit: Admitting: Radiology

## 2024-03-11 ENCOUNTER — Other Ambulatory Visit: Payer: Self-pay

## 2024-03-27 ENCOUNTER — Ambulatory Visit: Admitting: Radiology

## 2024-06-13 ENCOUNTER — Encounter: Payer: Self-pay | Admitting: Advanced Practice Midwife

## 2024-06-13 ENCOUNTER — Ambulatory Visit: Admitting: Advanced Practice Midwife

## 2024-06-13 VITALS — BP 143/94 | HR 91 | Ht 67.0 in

## 2024-06-13 DIAGNOSIS — Z30432 Encounter for removal of intrauterine contraceptive device: Secondary | ICD-10-CM | POA: Diagnosis not present

## 2024-06-13 NOTE — Progress Notes (Signed)
  Bonnie Ford 28 y.o.  Vitals:   06/13/24 1340 06/13/24 1346  BP: (!) 147/92 (!) 143/94  Pulse: 83 91   Past Medical History:  Diagnosis Date   Migraine    Past Surgical History:  Procedure Laterality Date   WISDOM TOOTH EXTRACTION     family history includes Brain cancer in her maternal grandfather; Breast cancer in her paternal grandmother; Cancer in her maternal grandfather and maternal grandmother; Diabetes in her mother and sister; Lung cancer in her maternal grandfather; Melanoma in her maternal grandfather; Multiple sclerosis in her mother; Thyroid  cancer in her mother; Thyroid  disease in her mother.  Current Outpatient Medications:    levonorgestrel (MIRENA, 52 MG,) 20 MCG/DAY IUD, 1 each by Intrauterine route once., Disp: , Rfl:    CRANBERRY PO, Take by mouth. (Patient not taking: Reported on 06/13/2024), Disp: , Rfl:    etonogestrel  (NEXPLANON ) 68 MG IMPL implant, 1 each by Subdermal route once. INSERTED LEFT ARM 04/22/2022, Disp: , Rfl:    metroNIDAZOLE  (FLAGYL ) 500 MG tablet, Take 1 tablet (500 mg total) by mouth 2 (two) times daily., Disp: 14 tablet, Rfl: 0   nitrofurantoin , macrocrystal-monohydrate, (MACROBID ) 100 MG capsule, Take 1 capsule (100 mg total) by mouth 2 (two) times daily., Disp: 14 capsule, Rfl: 0   rizatriptan  (MAXALT ) 10 MG tablet, Take 1 tablet (10 mg total) by mouth as needed for migraine. May repeat in 2 hours if needed (Patient not taking: Reported on 06/13/2024), Disp: 10 tablet, Rfl: 2   topiramate  (TOPAMAX ) 100 MG tablet, Take 1 tablet (100 mg total) by mouth at bedtime. (Patient not taking: Reported on 06/13/2024), Disp: 30 tablet, Rfl: 2    Here for IUD removal.  She had the mirena IUD placed about a year ago and would like it removed d/t continued cramps and no longer needs contraception (SSrelationship). .  A graves speculum was placed, and the strings were visible.  They were grasped with a curved Burnard and the IUD easily removed.  Pt given IUD  removal f/u instructions.   Pt states that BP is usuallly normal.  Has a BP cuff, will keep an eye on it.

## 2024-09-06 ENCOUNTER — Ambulatory Visit (INDEPENDENT_AMBULATORY_CARE_PROVIDER_SITE_OTHER): Admitting: Student in an Organized Health Care Education/Training Program

## 2024-09-06 ENCOUNTER — Encounter: Payer: Self-pay | Admitting: Student in an Organized Health Care Education/Training Program

## 2024-09-06 VITALS — BP 132/79 | HR 78 | Ht 67.0 in | Wt 242.0 lb

## 2024-09-06 DIAGNOSIS — E66812 Obesity, class 2: Secondary | ICD-10-CM | POA: Insufficient documentation

## 2024-09-06 DIAGNOSIS — Z131 Encounter for screening for diabetes mellitus: Secondary | ICD-10-CM

## 2024-09-06 DIAGNOSIS — E611 Iron deficiency: Secondary | ICD-10-CM | POA: Insufficient documentation

## 2024-09-06 DIAGNOSIS — Z13220 Encounter for screening for lipoid disorders: Secondary | ICD-10-CM

## 2024-09-06 DIAGNOSIS — Z114 Encounter for screening for human immunodeficiency virus [HIV]: Secondary | ICD-10-CM

## 2024-09-06 DIAGNOSIS — Z1159 Encounter for screening for other viral diseases: Secondary | ICD-10-CM

## 2024-09-06 DIAGNOSIS — I10 Essential (primary) hypertension: Secondary | ICD-10-CM | POA: Diagnosis not present

## 2024-09-06 DIAGNOSIS — R42 Dizziness and giddiness: Secondary | ICD-10-CM | POA: Insufficient documentation

## 2024-09-06 DIAGNOSIS — G43109 Migraine with aura, not intractable, without status migrainosus: Secondary | ICD-10-CM

## 2024-09-06 DIAGNOSIS — Z72820 Sleep deprivation: Secondary | ICD-10-CM | POA: Insufficient documentation

## 2024-09-06 NOTE — Progress Notes (Signed)
 New Patient Office Visit  Patient ID: Bonnie Ford, Female   DOB: 06/29/1996 28 y.o. MRN: [id number]  Chief Complaint  Patient presents with   Establish Care    Would like physical  Last 2 months was seeing a urgent care Dr. Merilyn of her symptoms sound like POTS.    Subjective:     Bonnie Ford presents to establish care  HPI  Discussed the use of AI scribe software for clinical note transcription with the patient, who gave verbal consent to proceed.  History of Present Illness Bonnie Ford is a 28 year old female who presents with episodes of lightheadedness and nausea.  She has experienced episodes of lightheadedness and nausea for the past few years, with symptoms worsening in the past month or two. Lightheadedness occurs upon standing, particularly after getting out of bed, and is sometimes accompanied by a 'flutter' in her chest. Symptoms are exacerbated by heat exposure, and she reports that she has to sit or lie down when this occurs. She has not previously seen a doctor specifically for these symptoms.  She has a history of migraines, previously managed with topiramate  100 mg daily, which was discontinued due to memory issues impacting her work. Since discontinuation, her migraines have lessened in frequency. She experiences bloating regardless of dietary intake and has attempted a gluten-free diet without improvement. She also reports episodes of blurry vision, headaches, and tingling in her fingertips and toes, which were attributed to her migraines.  Her sleep is significantly impacted by insomnia, exacerbated by her swing shift work schedule, which includes 12-hour shifts alternating between day and night shifts. She typically sleeps only 3-4 hours per day. She has a history of being evaluated for bipolar disorder, but was advised that her symptoms were related to her sleep schedule and caffeine intake. She consumes caffeine regularly, including energy drinks during her  shifts.  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 She was previously on [facility] for weight loss, which resulted in significant weight loss but was discontinued due to gastrointestinal side effects. Her weight has since fluctuated.  Her family history includes a mother who had thyroid  cancer. She has no known hearing or vision issues, though she reports occasional hearing difficulties in one ear, possibly related to past swimmer's ear and a history of competitive swimming.  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 She reports vaping but denies alcohol or drug use.   Outpatient Encounter Medications as of 09/06/2024  Medication Sig   [DISCONTINUED] CRANBERRY PO Take by mouth. (Patient not taking: Reported on 06/13/2024)   [DISCONTINUED] etonogestrel  (NEXPLANON ) 68 MG IMPL implant 1 each by Subdermal route once. INSERTED LEFT ARM 04/22/2022 (Patient not taking: Reported on 09/06/2024)   [DISCONTINUED] levonorgestrel (MIRENA, 52 MG,) 20 MCG/DAY IUD 1 each by Intrauterine route once. (Patient not taking: Reported on 09/06/2024)   [DISCONTINUED] metroNIDAZOLE  (FLAGYL ) 500 MG tablet Take 1 tablet (500 mg total) by mouth 2 (two) times daily. (Patient not taking: Reported on 09/06/2024)   [DISCONTINUED] nitrofurantoin , macrocrystal-monohydrate, (MACROBID ) 100 MG capsule Take 1 capsule (100 mg total) by mouth 2 (two) times daily. (Patient not taking: Reported on 09/06/2024)   [DISCONTINUED] rizatriptan  (MAXALT ) 10 MG tablet Take 1 tablet (10 mg total) by mouth as needed for migraine. May repeat in 2 hours if needed (Patient not taking: Reported on 06/13/2024)   [DISCONTINUED] topiramate  (TOPAMAX ) 100 MG  tablet Take 1 tablet (100 mg total) by mouth at bedtime. (Patient not taking: Reported on 06/13/2024)   No facility-administered encounter medications on file as of 09/06/2024.     Past Medical History:  Diagnosis Date   Migraine     Past Surgical History:  Procedure Laterality Date   WISDOM TOOTH EXTRACTION      Family History  Problem Relation Age of Onset   Diabetes Mother    Thyroid  disease Mother    Multiple sclerosis Mother    Thyroid  cancer Mother    Heart disease Father    Diabetes Sister    Cancer Maternal Grandmother        cervical   Cancer Maternal Grandfather    Brain cancer Maternal Grandfather    Melanoma Maternal Grandfather    Lung cancer Maternal Grandfather    Breast cancer Paternal Grandmother       Objective:    BP 132/79   Pulse 78   Ht 5' 7 (1.702 m)   Wt 242 lb (109.8 kg)   SpO2 100%   BMI 37.90 kg/m   Physical Exam  Gen: Well-appearing young woman Eyes: Normal Ears: Normal hearing, normal tympanic membranes bilaterally Neck: Normal, no nodules or adenopathy Heart: Regular, no murmur Lungs: Unlabored, clear throughout Abd: Obese, soft, nontender, no striae, no hernia, no organomegaly Ext: Warm, no edema, normal joints Neuro: Alert, conversational, full strength upper and lower extremities, normal gait balance Psych: Appropriate mood and affect, not anxious or depressed appearing, calm, normal speech, organized thoughts     Assessment & Plan:   Problem List Items Addressed This Visit       High   Migraine (Chronic)   Her migraines, previously managed with topiramate , were discontinued due to memory side effects.  She has a low burden of migraine symptoms currently, likely we can treat these as needed with NSAIDs or a triptan in the future.      Hypertension - Primary (Chronic)   Relevant Orders   Comprehensive metabolic panel with GFR   TSH   Obesity, Class II, BMI 35-39.9 (Chronic)   Chronic issue.  Weight currently 242 pounds and BMI of 37. She has significant weight fluctuations, previously using Wegovy with notable weight loss but discontinued due to gastrointestinal side effects. Her current  weight is 242 lbs, with a history of reaching 290 lbs. Encourage lifestyle modifications including a healthy diet, regular exercise, and stress reduction.      Relevant Orders   Comprehensive metabolic panel with GFR   TSH     Medium    Dizziness   She experiences intermittent lightheadedness and near-syncope, worsened by heat and rapid position changes. The differential diagnosis includes anemia, thyroid  dysfunction, or stress-related symptoms. Work stress and lifestyle factors likely contribute. Ordered blood work for anemia, thyroid  function, electrolytes, kidney function, and blood sugar levels.  For now I recommended lifestyle interventions including improving sleep, improve diet, exercise on a regular basis, reduce stress, stay hydrated throughout the day, reduce caffeine, and discontinue tobacco vape products.        Low   Poor sleep (Chronic)   Chronic poor sleep is worsened by her swing shift work schedule and stress. She currently sleeps 3-4 hours per day, contributing to fatigue and stress. Recommended improving sleep hygiene by obtaining 7-8 hours of sleep per night. Advised finding employment with a more regular daytime schedule to improve sleep patterns.      Iron deficiency   I suspect she has iron  deficiency based on her symptoms of fatigue, lightheadedness with standing.  She recently restarted menstrual periods after the IUD was removed.  She has nutrition patterns that put her at risk for iron deficiency.  Will check iron levels today with a CBC.      Relevant Orders   CBC   Iron, TIBC and Ferritin Panel   Other Visit Diagnoses       Screening for lipid disorders       Relevant Orders   Lipid panel     Screening for diabetes mellitus       Relevant Orders   Hemoglobin A1c     Encounter for HCV screening test for low risk patient       Relevant Orders   Hepatitis C antibody     Screening for HIV (human immunodeficiency virus)       Relevant Orders   HIV  Antibody (routine testing w rflx)       Return in about 3 months (around 12/07/2024).   Cleatus Debby Specking, MD [facility]

## 2024-09-06 NOTE — Assessment & Plan Note (Signed)
 Chronic issue.  Weight currently 242 pounds and BMI of 37. She has significant weight fluctuations, previously using Wegovy with notable weight loss but discontinued due to gastrointestinal side effects. Her current weight is 242 lbs, with a history of reaching 290 lbs. Encourage lifestyle modifications including a healthy diet, regular exercise, and stress reduction.

## 2024-09-06 NOTE — Assessment & Plan Note (Signed)
 Her migraines, previously managed with topiramate , were discontinued due to memory side effects.  She has a low burden of migraine symptoms currently, likely we can treat these as needed with NSAIDs or a triptan in the future.

## 2024-09-06 NOTE — Assessment & Plan Note (Signed)
 I suspect she has iron deficiency based on her symptoms of fatigue, lightheadedness with standing.  She recently restarted menstrual periods after the IUD was removed.  She has nutrition patterns that put her at risk for iron deficiency.  Will check iron levels today with a CBC.

## 2024-09-06 NOTE — Assessment & Plan Note (Signed)
 Chronic poor sleep is worsened by her swing shift work schedule and stress. She currently sleeps 3-4 hours per day, contributing to fatigue and stress. Recommended improving sleep hygiene by obtaining 7-8 hours of sleep per night. Advised finding employment with a more regular daytime schedule to improve sleep patterns.

## 2024-09-06 NOTE — Patient Instructions (Signed)
  VISIT SUMMARY: During your visit, we discussed your episodes of lightheadedness and nausea, migraines, insomnia, weight fluctuations, and tobacco use. We also reviewed your general health maintenance and lifestyle habits.  YOUR PLAN: -LIGHTHEADEDNESS AND NEAR-SYNCOPE: Your episodes of lightheadedness and near-fainting are likely related to factors such as anemia, thyroid  issues, or stress. We will conduct blood tests to check for these conditions. In the meantime, try to avoid rapid position changes and stay cool to prevent symptoms.  -MIGRAINE: Migraines are severe headaches that can cause symptoms like blurry vision and tingling. Since you stopped taking topiramate , your migraines have lessened. We will continue to monitor your symptoms and adjust treatment as needed.  -INSOMNIA: Insomnia is difficulty sleeping, which can be worsened by irregular work schedules and stress. Aim to get 7-8 hours of sleep per night and consider finding a job with a more regular daytime schedule to improve your sleep patterns.  -OBESITY: Obesity is having excess body weight. You have experienced weight fluctuations and previously used [facility] for weight loss. Focus on a healthy diet, regular exercise, and stress reduction to manage your weight.  -TOBACCO USE: You currently use vaping tobacco. Reducing or quitting tobacco use can significantly improve your overall health.  -GENERAL HEALTH MAINTENANCE: To improve your overall health, focus on stress reduction, regular exercise, a healthy diet, and adequate hydration. These lifestyle changes can help manage your symptoms and improve your well-being.  INSTRUCTIONS: We will conduct blood tests to check for anemia, thyroid  function, electrolytes, kidney function, and blood sugar levels. Please follow up with us  once the results are available.

## 2024-09-06 NOTE — Assessment & Plan Note (Signed)
 She experiences intermittent lightheadedness and near-syncope, worsened by heat and rapid position changes. The differential diagnosis includes anemia, thyroid  dysfunction, or stress-related symptoms. Work stress and lifestyle factors likely contribute. Ordered blood work for anemia, thyroid  function, electrolytes, kidney function, and blood sugar levels.  For now I recommended lifestyle interventions including improving sleep, improve diet, exercise on a regular basis, reduce stress, stay hydrated throughout the day, reduce caffeine, and discontinue tobacco vape products.

## 2024-09-07 LAB — LIPID PANEL
Cholesterol: 168 mg/dL (ref <200)
HDL: 50 mg/dL (ref >=50)
LDL Cholesterol (Calc): 91 mg/dL
Non-HDL Cholesterol (Calc): 118 mg/dL (ref <130)
Total CHOL/HDL Ratio: 3.4 (calc) (ref <5.0)
Triglycerides: 170 mg/dL — ABNORMAL HIGH (ref <150)

## 2024-09-07 LAB — COMPREHENSIVE METABOLIC PANEL WITH GFR
AG Ratio: 1.8 (calc) (ref 1.0–2.5)
ALT: 19 U/L (ref 6–29)
AST: 14 U/L (ref 10–30)
Albumin: 4.6 g/dL (ref 3.6–5.1)
Alkaline phosphatase (APISO): 81 U/L (ref 31–125)
BUN: 14 mg/dL (ref 7–25)
CO2: 26 mmol/L (ref 20–32)
Calcium: 9.4 mg/dL (ref 8.6–10.2)
Chloride: 104 mmol/L (ref 98–110)
Creat: 0.73 mg/dL (ref 0.50–0.96)
Globulin: 2.6 g/dL (ref 1.9–3.7)
Glucose, Bld: 63 mg/dL — ABNORMAL LOW (ref 65–99)
Potassium: 4.3 mmol/L (ref 3.5–5.3)
Sodium: 139 mmol/L (ref 135–146)
Total Bilirubin: 0.5 mg/dL (ref 0.2–1.2)
Total Protein: 7.2 g/dL (ref 6.1–8.1)
eGFR: 116 mL/min/1.73m2 (ref >=60)

## 2024-09-07 LAB — CBC
HCT: 43 % (ref 35.0–45.0)
Hemoglobin: 14.7 g/dL (ref 11.7–15.5)
MCH: 30.4 pg (ref 27.0–33.0)
MCHC: 34.2 g/dL (ref 32.0–36.0)
MCV: 89 fL (ref 80.0–100.0)
MPV: 9.4 fL (ref 7.5–12.5)
Platelets: 362 Thousand/uL (ref 140–400)
RBC: 4.83 Million/uL (ref 3.80–5.10)
RDW: 12.2 % (ref 11.0–15.0)
WBC: 9.8 Thousand/uL (ref 3.8–10.8)

## 2024-09-07 LAB — HIV ANTIBODY (ROUTINE TESTING W REFLEX)
HIV 1&2 Ab, 4th Generation: NONREACTIVE
HIV FINAL INTERPRETATION: NEGATIVE

## 2024-09-07 LAB — IRON,TIBC AND FERRITIN PANEL
%SAT: 24 % (ref 16–45)
Ferritin: 52 ng/mL (ref 16–154)
Iron: 86 ug/dL (ref 40–190)
TIBC: 362 ug/dL (ref 250–450)

## 2024-09-07 LAB — TSH: TSH: 2.22 m[IU]/L

## 2024-09-07 LAB — HEMOGLOBIN A1C
Hgb A1c MFr Bld: 5.1 % (ref <5.7)
Mean Plasma Glucose: 100 mg/dL
eAG (mmol/L): 5.5 mmol/L

## 2024-09-07 LAB — HEPATITIS C ANTIBODY: Hepatitis C Ab: NONREACTIVE

## 2024-09-09 ENCOUNTER — Ambulatory Visit: Payer: Self-pay | Admitting: Student in an Organized Health Care Education/Training Program

## 2024-12-06 ENCOUNTER — Ambulatory Visit: Admitting: Student in an Organized Health Care Education/Training Program

## 2024-12-06 ENCOUNTER — Encounter: Payer: Self-pay | Admitting: Student in an Organized Health Care Education/Training Program
